# Patient Record
Sex: Female | Born: 1965 | Race: Black or African American | Hispanic: No | Marital: Single | Smoking: Never smoker
Health system: Southern US, Community
[De-identification: ages and names within clinical notes are randomized; demographics above are authoritative.]

## PROBLEM LIST (undated history)

## (undated) ENCOUNTER — Ambulatory Visit

## (undated) DIAGNOSIS — C50919 Malignant neoplasm of unspecified site of unspecified female breast: Secondary | ICD-10-CM

## (undated) DIAGNOSIS — T783XXA Angioneurotic edema, initial encounter: Secondary | ICD-10-CM

## (undated) DIAGNOSIS — I10 Essential (primary) hypertension: Secondary | ICD-10-CM

## (undated) DIAGNOSIS — E039 Hypothyroidism, unspecified: Secondary | ICD-10-CM

## (undated) DIAGNOSIS — F32A Depression, unspecified: Secondary | ICD-10-CM

## (undated) DIAGNOSIS — M1990 Unspecified osteoarthritis, unspecified site: Secondary | ICD-10-CM

## (undated) DIAGNOSIS — M25562 Pain in left knee: Secondary | ICD-10-CM

## (undated) DIAGNOSIS — T7840XA Allergy, unspecified, initial encounter: Secondary | ICD-10-CM

## (undated) DIAGNOSIS — G4730 Sleep apnea, unspecified: Secondary | ICD-10-CM

## (undated) DIAGNOSIS — M25561 Pain in right knee: Secondary | ICD-10-CM

## (undated) DIAGNOSIS — L509 Urticaria, unspecified: Secondary | ICD-10-CM

## (undated) DIAGNOSIS — E079 Disorder of thyroid, unspecified: Secondary | ICD-10-CM

## (undated) DIAGNOSIS — Z5189 Encounter for other specified aftercare: Secondary | ICD-10-CM

## (undated) HISTORY — DX: Depression, unspecified: F32.A

## (undated) HISTORY — DX: Malignant neoplasm of unspecified site of unspecified female breast: C50.919

## (undated) HISTORY — DX: Urticaria, unspecified: L50.9

## (undated) HISTORY — DX: Morbid (severe) obesity due to excess calories: E66.01

## (undated) HISTORY — PX: TUBAL LIGATION: SHX77

## (undated) HISTORY — DX: Allergy, unspecified, initial encounter: T78.40XA

## (undated) HISTORY — PX: OTHER SURGICAL HISTORY: SHX169

## (undated) HISTORY — DX: Unspecified osteoarthritis, unspecified site: M19.90

## (undated) HISTORY — DX: Sleep apnea, unspecified: G47.30

## (undated) HISTORY — PX: ADRENALECTOMY: SHX876

## (undated) HISTORY — DX: Angioneurotic edema, initial encounter: T78.3XXA

## (undated) HISTORY — DX: Pain in left knee: M25.562

## (undated) HISTORY — DX: Pain in right knee: M25.561

## (undated) HISTORY — DX: Encounter for other specified aftercare: Z51.89

## (undated) HISTORY — PX: THYROIDECTOMY, PARTIAL: SHX18

## (undated) HISTORY — PX: BREAST BIOPSY: SHX20

## (undated) HISTORY — PX: ABDOMINAL HYSTERECTOMY: SHX81

## (undated) HISTORY — DX: Disorder of thyroid, unspecified: E07.9

---
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
## 2020-07-05 ENCOUNTER — Other Ambulatory Visit: Payer: Self-pay

## 2020-07-05 ENCOUNTER — Encounter: Payer: Self-pay | Admitting: Internal Medicine

## 2020-07-05 ENCOUNTER — Ambulatory Visit (INDEPENDENT_AMBULATORY_CARE_PROVIDER_SITE_OTHER)
Admission: RE | Admit: 2020-07-05 | Discharge: 2020-07-05 | Disposition: A | Discharge status: Home / Self Care | Payer: Commercial Managed Care - PPO | Source: Ambulatory Visit | Attending: Internal Medicine | Admitting: Internal Medicine

## 2020-07-05 ENCOUNTER — Ambulatory Visit: Payer: Commercial Managed Care - PPO | Admitting: Internal Medicine

## 2020-07-05 VITALS — BP 120/80 | HR 74 | Temp 98.8°F | Ht 70.0 in | Wt 314.2 lb

## 2020-07-05 DIAGNOSIS — M25562 Pain in left knee: Secondary | ICD-10-CM

## 2020-07-05 DIAGNOSIS — M25561 Pain in right knee: Secondary | ICD-10-CM

## 2020-07-05 DIAGNOSIS — Z23 Encounter for immunization: Secondary | ICD-10-CM

## 2020-07-05 DIAGNOSIS — Z803 Family history of malignant neoplasm of breast: Secondary | ICD-10-CM

## 2020-07-05 DIAGNOSIS — Z124 Encounter for screening for malignant neoplasm of cervix: Secondary | ICD-10-CM | POA: Diagnosis not present
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
## 2020-07-05 NOTE — Addendum Note (Signed)
Addended by: Kern Reap B on: 07/05/2020 04:54 PM   Modules accepted: Orders

## 2020-07-05 NOTE — Patient Instructions (Signed)
-  Nice seeing you today!!  -Xrays of both knees have been requested today.  -Schedule follow up in 3 months for your physical. Please come in fasting that day.

## 2020-07-11 ENCOUNTER — Other Ambulatory Visit: Payer: Self-pay | Admitting: Internal Medicine

## 2020-07-11 DIAGNOSIS — Z1231 Encounter for screening mammogram for malignant neoplasm of breast: Secondary | ICD-10-CM

## 2020-07-11 DIAGNOSIS — M25562 Pain in left knee: Secondary | ICD-10-CM

## 2020-07-17 ENCOUNTER — Ambulatory Visit: Payer: Commercial Managed Care - PPO | Admitting: Family Medicine

## 2020-07-17 ENCOUNTER — Other Ambulatory Visit: Payer: Self-pay

## 2020-07-17 ENCOUNTER — Ambulatory Visit: Payer: Self-pay

## 2020-07-17 ENCOUNTER — Encounter: Payer: Self-pay | Admitting: Family Medicine

## 2020-07-17 VITALS — BP 120/74 | HR 68 | Ht 70.0 in | Wt 309.2 lb

## 2020-07-17 DIAGNOSIS — M25562 Pain in left knee: Secondary | ICD-10-CM

## 2020-07-17 DIAGNOSIS — G8929 Other chronic pain: Secondary | ICD-10-CM | POA: Diagnosis not present

## 2020-07-17 DIAGNOSIS — M25561 Pain in right knee: Secondary | ICD-10-CM

## 2020-07-17 NOTE — Progress Notes (Signed)
Subjective:    I'm seeing this patient as a consultation for:  Dr Montez Hageman. Note will be routed back to referring provider/PCP.  CC: B knee pain, L>R  I, Molly Weber, LAT, ATC, am serving as scribe for Dr. [name].  HPI: Pt is a 54 y/o female presenting w/ c/o chronic B knee pain, L>R, x 8-9 months w/ no known MOI.  She locates her L knee pain to medial and posterior and R knee pain to ant and post knee.  She notes that her L knee hurts more and her R knee swells more.  Radiating pain: No Knee swelling: yes in R knee B knee mechanical symptoms: yes Aggravating factors: weight bearing activity; stairs; transitioning from sit-to-stand Treatments tried: PT in 2019; Aleve; IBU; knee brace  Diagnostic imaging: B knee XR- 07/05/20  Past medical history, Surgical history, Family history, Social history, Allergies, and medications have been entered into the medical record, reviewed.   Review of Systems: No new headache, visual changes, nausea, vomiting, diarrhea, constipation, dizziness, abdominal pain, skin rash, fevers, chills, night sweats, weight loss, swollen lymph nodes, body aches, joint swelling, muscle aches, chest pain, shortness of breath, mood changes, visual or auditory hallucinations.   Objective:    Vitals:   07/17/20 0836  BP: 120/74  Pulse: 68  SpO2: 96%   General: Well Developed, well nourished, and in no acute distress.  Neuro/Psych: Alert and oriented x3, extra-ocular muscles intact, able to move all 4 extremities, sensation grossly intact. Skin: Warm and dry, no rashes noted.  Respiratory: Not using accessory muscles, speaking in full sentences, trachea midline.  Cardiovascular: Pulses palpable, no extremity edema. Abdomen: Does not appear distended. MSK: Bilateral knees difficult to tell effusion based on body habitus. Right knee no obvious effusion.  Varicosities present medial knee. Range of motion 0-120 degrees with crepitation. Tender palpation  medial joint line. Stable ligamentous exam. Negative McMurray's test.  Left knee no obvious effusion.  No significant varicosities. Range of motion 0-120 degrees with crepitation. Tender palpation medial joint line. Stable ligamentous exam. Negative Murray's test.   Lab and Radiology Results DG Knee Complete 4 Views Left  Result Date: 07/06/2020 CLINICAL DATA:  Left knee pain for 1 week. EXAM: LEFT KNEE - COMPLETE 4+ VIEW COMPARISON:  None. FINDINGS: The sunrise view is limited. The mineralization and alignment are normal. There is no evidence of acute fracture or dislocation. There are mild tricompartmental degenerative changes. No joint effusion or foreign body. IMPRESSION: No acute osseous findings. Mild tricompartmental degenerative changes. Electronically Signed   By: Carey Bullocks M.D.   On: 07/06/2020 15:07   DG Knee Complete 4 Views Right  Result Date: 07/06/2020 CLINICAL DATA:  Right knee pain and swelling for 1-2 weeks. No known injury. EXAM: RIGHT KNEE - COMPLETE 4+ VIEW COMPARISON:  None. FINDINGS: The sunrise view is mildly limited. The mineralization and alignment are normal. There is no evidence of acute fracture or dislocation. There are mild tricompartmental degenerative changes. Probable medial varicosities. No joint effusion or other focal soft tissue abnormalities identified. IMPRESSION: Mild tricompartmental degenerative changes. No acute osseous findings. Electronically Signed   By: Carey Bullocks M.D.   On: 07/06/2020 15:08   I, [name], personally (independently) visualized and performed the interpretation of the images attached in this note. Agree with radiology over read.  Mild medial compartment DJD both knees.  Procedure: Real-time Ultrasound Guided Injection of right knee lateral superior patellar space Device: Philips Affiniti 50G Images permanently stored  and available for review in PACS Verbal informed consent obtained.  Discussed risks and benefits of  procedure. Warned about infection bleeding damage to structures skin hypopigmentation and fat atrophy among others. Patient expresses understanding and agreement Time-out conducted.   Noted no overlying erythema, induration, or other signs of local infection.   Skin prepped in a sterile fashion.   Local anesthesia: Topical Ethyl chloride.   With sterile technique and under real time ultrasound guidance:  40 mg of Kenalog and 2 mL of Marcaine injected easily.   Completed without difficulty   Pain immediately resolved suggesting accurate placement of the medication.   Advised to call if fevers/chills, erythema, induration, drainage, or persistent bleeding.   Images permanently stored and available for review in the ultrasound unit.  Impression: Technically successful ultrasound guided injection.  Procedure: Real-time Ultrasound Guided Injection of left knee lateral superior patellar space Device: Philips Affiniti 50G Images permanently stored and available for review in PACS Verbal informed consent obtained.  Discussed risks and benefits of procedure. Warned about infection bleeding damage to structures skin hypopigmentation and fat atrophy among others. Patient expresses understanding and agreement Time-out conducted.   Noted no overlying erythema, induration, or other signs of local infection.   Skin prepped in a sterile fashion.   Local anesthesia: Topical Ethyl chloride.   With sterile technique and under real time ultrasound guidance:  40 mg of Kenalog and 2 mL of Marcaine injected easily.   Completed without difficulty   Pain immediately resolved suggesting accurate placement of the medication.   Advised to call if fevers/chills, erythema, induration, drainage, or persistent bleeding.   Images permanently stored and available for review in the ultrasound unit.  Impression: Technically successful ultrasound guided injection.      Impression and Recommendations:    Assessment and  Plan: 54 y.o. female with bilateral knee pain due to DJD.  Pain ongoing for several months now.  Discussed options.  Plan for quad strengthening Voltaren gel and steroid injection as above.  Also recommend Tylenol and limited oral NSAIDs.  Discussed that weight loss will be very helpful as well and will be essential for total knee replacement.  BMI must be less than 40 for a total knee replacement which is an achievable goal for her.  She is already working on weight loss on her own which is an excellent idea.  If steroid injections do not last long enough could proceed with hyaluronic acid injections.  Patient will notify me if we need to proceed with that.  Recheck back with me as needed..   Orders Placed This Encounter  Procedures  . [place] LIMITED JOINT SPACE STRUCTURES LOW BILAT(NO LINKED CHARGES)    Order Specific Question:   Reason for Exam (SYMPTOM  OR DIAGNOSIS REQUIRED)    Answer:   B knee pain    Order Specific Question:   Preferred imaging location?    Answer:   [facility]   No orders of the defined types were placed in this encounter.   Discussed warning signs or symptoms. Please see discharge instructions. Patient expresses understanding.   The above documentation has been reviewed and is accurate and complete [name], M.D.

## 2020-07-17 NOTE — Patient Instructions (Signed)
Thank you for coming in today. Work on Systems developer.  Work on weight loss.  Use voltaren gel up to 4x daily.  Recheck with me as needed.

## 2020-08-06 ENCOUNTER — Other Ambulatory Visit: Payer: Self-pay | Admitting: Internal Medicine

## 2020-08-06 DIAGNOSIS — Z1231 Encounter for screening mammogram for malignant neoplasm of breast: Secondary | ICD-10-CM

## 2020-08-07 ENCOUNTER — Telehealth: Payer: Self-pay | Admitting: *Deleted

## 2020-08-07 ENCOUNTER — Encounter: Payer: Self-pay | Admitting: Nurse Practitioner

## 2020-08-07 ENCOUNTER — Ambulatory Visit (INDEPENDENT_AMBULATORY_CARE_PROVIDER_SITE_OTHER): Payer: Commercial Managed Care - PPO | Admitting: Nurse Practitioner

## 2020-08-07 ENCOUNTER — Other Ambulatory Visit: Payer: Self-pay

## 2020-08-07 VITALS — BP 120/82 | Ht 70.0 in | Wt 305.2 lb

## 2020-08-07 DIAGNOSIS — Z803 Family history of malignant neoplasm of breast: Secondary | ICD-10-CM | POA: Diagnosis not present

## 2020-08-07 DIAGNOSIS — Z01419 Encounter for gynecological examination (general) (routine) without abnormal findings: Secondary | ICD-10-CM

## 2020-08-07 DIAGNOSIS — Z90711 Acquired absence of uterus with remaining cervical stump: Secondary | ICD-10-CM | POA: Diagnosis not present

## 2020-08-07 DIAGNOSIS — Z113 Encounter for screening for infections with a predominantly sexual mode of transmission: Secondary | ICD-10-CM | POA: Diagnosis not present

## 2020-08-07 NOTE — Telephone Encounter (Signed)
-----  Message from Tamela Gammon, NP sent at 08/07/2020 11:02 AM EDT ----- Please send referral to genetic specialist for BRCA testing.

## 2020-08-07 NOTE — Telephone Encounter (Signed)
Referral placed at [facility] they will call to schedule. 

## 2020-08-07 NOTE — Patient Instructions (Addendum)
[facility] Maintenance for Postmenopausal Women Menopause is a normal process in which your ability to get pregnant comes to an end. This process happens slowly over many months or years, usually between the ages of 22 and 53. Menopause is complete when you have missed your menstrual periods for 12 months. It is important to talk with your health care provider about some of the most common conditions that affect women after menopause (postmenopausal women). These include heart disease, cancer, and bone loss (osteoporosis). Adopting a healthy lifestyle and getting preventive care can help to promote your health and wellness. The actions you take can also lower your chances of developing some of these common conditions. What should I know about menopause? During menopause, you may get a number of symptoms, such as:  Hot flashes. These can be moderate or severe.  Night sweats.  Decrease in sex drive.  Mood swings.  Headaches.  Tiredness.  Irritability.  Memory problems.  Insomnia. Choosing to treat or not to treat these symptoms is a decision that you make with your health care provider. Do I need hormone replacement therapy?  Hormone replacement therapy is effective in treating symptoms that are caused by menopause, such as hot flashes and night sweats.  Hormone replacement carries certain risks, especially as you become older. If you are thinking about using estrogen or estrogen with progestin, discuss the benefits and risks with your health care provider. What is my risk for heart disease and stroke? The risk of heart disease, heart attack, and stroke increases as you age. One of the causes may be a change in the body's hormones during menopause. This can affect how your body uses dietary fats, triglycerides, and cholesterol. Heart attack and stroke are medical emergencies. There are many things that you can do to help prevent heart disease and stroke. Watch your blood  pressure  High blood pressure causes heart disease and increases the risk of stroke. This is more likely to develop in people who have high blood pressure readings, are of African descent, or are overweight.  Have your blood pressure checked: ? Every 3-5 years if you are 6-33 years of age. ? Every year if you are 48 years old or older. Eat a healthy diet   Eat a diet that includes plenty of vegetables, fruits, low-fat dairy products, and lean protein.  Do not eat a lot of foods that are high in solid fats, added sugars, or sodium. Get regular exercise Get regular exercise. This is one of the most important things you can do for your health. Most adults should:  Try to exercise for at least 150 minutes each week. The exercise should increase your heart rate and make you sweat (moderate-intensity exercise).  Try to do strengthening exercises at least twice each week. Do these in addition to the moderate-intensity exercise.  Spend less time sitting. Even light physical activity can be beneficial. Other tips  Work with your health care provider to achieve or maintain a healthy weight.  Do not use any products that contain nicotine or tobacco, such as cigarettes, e-cigarettes, and chewing tobacco. If you need help quitting, ask your health care provider.  Know your numbers. Ask your health care provider to check your cholesterol and your blood sugar (glucose). Continue to have your blood tested as directed by your health care provider. Do I need screening for cancer? Depending on your health history and family history, you may need to have cancer screening at different stages of  your life. This may include screening for:  Breast cancer.  Cervical cancer.  Lung cancer.  Colorectal cancer. What is my risk for osteoporosis? After menopause, you may be at increased risk for osteoporosis. Osteoporosis is a condition in which bone destruction happens more quickly than new bone creation.  To help prevent osteoporosis or the bone fractures that can happen because of osteoporosis, you may take the following actions:  If you are 47-48 years old, get at least 1,000 mg of calcium and at least 600 mg of vitamin D per day.  If you are older than age 33 but younger than age 25, get at least 1,200 mg of calcium and at least 600 mg of vitamin D per day.  If you are older than age 24, get at least 1,200 mg of calcium and at least 800 mg of vitamin D per day. Smoking and drinking excessive alcohol increase the risk of osteoporosis. Eat foods that are rich in calcium and vitamin D, and do weight-bearing exercises several times each week as directed by your health care provider. How does menopause affect my mental health? Depression may occur at any age, but it is more common as you become older. Common symptoms of depression include:  Low or sad mood.  Changes in sleep patterns.  Changes in appetite or eating patterns.  Feeling an overall lack of motivation or enjoyment of activities that you previously enjoyed.  Frequent crying spells. Talk with your health care provider if you think that you are experiencing depression. General instructions See your health care provider for regular wellness exams and vaccines. This may include:  Scheduling regular health, dental, and eye exams.  Getting and maintaining your vaccines. These include: ? Influenza vaccine. Get this vaccine each year before the flu season begins. ? Pneumonia vaccine. ? Shingles vaccine. ? Tetanus, diphtheria, and pertussis (Tdap) booster vaccine. Your health care provider may also recommend other immunizations. Tell your health care provider if you have ever been abused or do not feel safe at home. Summary  Menopause is a normal process in which your ability to get pregnant comes to an end.  This condition causes hot flashes, night sweats, decreased interest in sex, mood swings, headaches, or lack of  sleep.  Treatment for this condition may include hormone replacement therapy.  Take actions to keep yourself healthy, including exercising regularly, eating a healthy diet, watching your weight, and checking your blood pressure and blood sugar levels.  Get screened for cancer and depression. Make sure that you are up to date with all your vaccines. This information is not intended to replace advice given to you by your health care provider. Make sure you discuss any questions you have with your health care provider. Document Revised: 10/13/2018 Document Reviewed: 10/13/2018 Elsevier Patient Education  2020 [employer].

## 2020-08-07 NOTE — Progress Notes (Signed)
   Pamela Medina 1966/04/07 [id number]   History:  54 y.o. G2P0021 presents as new patient to establish care. 2006 partial hysterectomy due to fibroids. Normal pap and mammogram history. Mother with history of breast and ovarian cancer, MGM with breast cancer, and two maternal aunts with breast and uterine cancer. She has not been tested for BRCA gene. Sexually active, would like STD testing today.   Gynecologic History No LMP recorded. Patient has had a hysterectomy.   Last Pap: 4 years ago. Results were: normal Last mammogram: >1 year ago. Results were: normal Last colonoscopy: 2019. Results were: polyps 3 year follow up recommended Last DEXA: Never  Past medical history, past surgical history, family history and social history were all reviewed and documented in the EPIC chart.  ROS:  A ROS was performed and pertinent positives and negatives are included.  Exam:  Vitals:   08/07/20 1042  BP: 120/82  Weight: (!) 305 lb 3.2 oz (138.4 kg)  Height: _0  (1.778 m)   Body mass index is 43.79 kg/m.  General appearance:  Normal Thyroid:  Symmetrical, normal in size, without palpable masses or nodularity. Respiratory  Auscultation:  Clear without wheezing or rhonchi Cardiovascular  Auscultation:  Regular rate, without rubs, murmurs or gallops  Edema/varicosities:  Not grossly evident Abdominal  Soft,nontender, without masses, guarding or rebound.  Liver/spleen:  No organomegaly noted  Hernia:  None appreciated  Skin  Inspection:  Grossly normal   Breasts: Examined lying and sitting.   Right: Without masses, retractions, discharge or axillary adenopathy.   Left: Without masses, retractions, discharge or axillary adenopathy. Gentitourinary   Inguinal/mons:  Normal without inguinal adenopathy  External genitalia:  Normal  BUS/Urethra/Skene's glands:  Normal  Vagina:  Normal  Cervix:  Normal  Uterus: Difficult to palpate due to body habitus but no gross masses or  tenderness  Adnexa/parametria:     Rt: Without masses or tenderness.   Lt: Without masses or tenderness.  Anus and perineum: Normal   Assessment/Plan:  54 y.o. [id number] as new patient.    Well woman exam with routine gynecological exam - Education provided on SBEs, importance of preventative screenings, current guidelines, high calcium diet, regular exercise, and multivitamin daily. Labs with primary care.   Family history of breast cancer - mother, MGM, and two aunts. Recommend BRCA testing for her and her daughter. Will send referral for genetic specialist.  Screen for STD (sexually transmitted disease) - Plan: C. trachomatis/N. gonorrhoeae RNA, HIV Antibody (routine testing w rflx), RPR  History of partial hysterectomy - 2006 for fibroids  Screening for cervical cancer -most recent Pap greater than 4 years ago.  Normal Pap history.  Pap with HR HPV typing today.   Screening for breast cancer -normal mammogram history.  Normal breast exam today.  Schedule for mammogram 08/09/2020.  Follow up in 1 year for annual       Regino Ramirez, 10:53 AM 08/07/2020

## 2020-08-08 LAB — HIV ANTIBODY (ROUTINE TESTING W REFLEX): HIV 1&2 Ab, 4th Generation: NONREACTIVE

## 2020-08-08 LAB — C. TRACHOMATIS/N. GONORRHOEAE RNA
C. trachomatis RNA, TMA: NOT DETECTED
N. gonorrhoeae RNA, TMA: NOT DETECTED

## 2020-08-08 LAB — RPR: RPR Ser Ql: NONREACTIVE

## 2020-08-09 ENCOUNTER — Telehealth: Payer: Self-pay | Admitting: Genetic Counselor

## 2020-08-09 ENCOUNTER — Ambulatory Visit
Admission: RE | Admit: 2020-08-09 | Discharge: 2020-08-09 | Disposition: A | Discharge status: Home / Self Care | Payer: Commercial Managed Care - PPO | Source: Ambulatory Visit | Attending: Internal Medicine | Admitting: Internal Medicine

## 2020-08-09 ENCOUNTER — Other Ambulatory Visit: Payer: Self-pay | Admitting: Nurse Practitioner

## 2020-08-09 ENCOUNTER — Other Ambulatory Visit: Payer: Self-pay

## 2020-08-09 DIAGNOSIS — N760 Acute vaginitis: Secondary | ICD-10-CM

## 2020-08-09 DIAGNOSIS — Z1231 Encounter for screening mammogram for malignant neoplasm of breast: Secondary | ICD-10-CM

## 2020-08-09 LAB — PAP, TP IMAGING W/ HPV RNA, RFLX HPV TYPE 16,18/45: HPV DNA High Risk: NOT DETECTED

## 2020-08-09 MED ORDER — METRONIDAZOLE 0.75 % VA GEL: 1.0000 | Freq: Two times a day (BID) | VAGINAL | 0 refills | Status: AC

## 2020-08-09 NOTE — Telephone Encounter (Signed)
Received a genetic counseling referral from [facility] for fhx of breast cancer. Pamela Medina has been cld and scheduled to see Cari on 10/28 at 1pm. Pt aware to arrive 15 minutes early.

## 2020-08-10 NOTE — Telephone Encounter (Signed)
Patient scheduled on 08/30/20 @ 2:00pm

## 2020-08-23 ENCOUNTER — Ambulatory Visit (INDEPENDENT_AMBULATORY_CARE_PROVIDER_SITE_OTHER): Payer: Commercial Managed Care - PPO | Admitting: Internal Medicine

## 2020-08-23 ENCOUNTER — Encounter: Payer: Self-pay | Admitting: Internal Medicine

## 2020-08-23 ENCOUNTER — Other Ambulatory Visit: Payer: Self-pay

## 2020-08-23 VITALS — BP 130/90 | HR 63 | Temp 97.9°F | Ht 69.75 in | Wt 310.7 lb

## 2020-08-23 DIAGNOSIS — Z0000 Encounter for general adult medical examination without abnormal findings: Secondary | ICD-10-CM

## 2020-08-23 NOTE — Patient Instructions (Signed)
-Nice seeing you today!!  -Lab work today; will notify you once results are available.  -Remember your shingles and COVID booster at the pharmacy.  -Schedule follow up in 1 year or sooner as needed.   Preventive Care 33-52 Years Old, Female Preventive care refers to visits with your health care provider and lifestyle choices that can promote health and wellness. This includes:  A yearly physical exam. This may also be called an annual well check.  Regular dental visits and eye exams.  Immunizations.  Screening for certain conditions.  Healthy lifestyle choices, such as eating a healthy diet, getting regular exercise, not using drugs or products that contain nicotine and tobacco, and limiting alcohol use. What can I expect for my preventive care visit? Physical exam Your health care provider will check your:  Height and weight. This may be used to calculate body mass index (BMI), which tells if you are at a healthy weight.  Heart rate and blood pressure.  Skin for abnormal spots. Counseling Your health care provider may ask you questions about your:  Alcohol, tobacco, and drug use.  Emotional well-being.  Home and relationship well-being.  Sexual activity.  Eating habits.  Work and work Statistician.  Method of birth control.  Menstrual cycle.  Pregnancy history. What immunizations do I need?  Influenza (flu) vaccine  This is recommended every year. Tetanus, diphtheria, and pertussis (Tdap) vaccine  You may need a Td booster every 10 years. Varicella (chickenpox) vaccine  You may need this if you have not been vaccinated. Zoster (shingles) vaccine  You may need this after age 45. Measles, mumps, and rubella (MMR) vaccine  You may need at least one dose of MMR if you were born in 1957 or later. You may also need a second dose. Pneumococcal conjugate (PCV13) vaccine  You may need this if you have certain conditions and were not previously  vaccinated. Pneumococcal polysaccharide (PPSV23) vaccine  You may need one or two doses if you smoke cigarettes or if you have certain conditions. Meningococcal conjugate (MenACWY) vaccine  You may need this if you have certain conditions. Hepatitis A vaccine  You may need this if you have certain conditions or if you travel or work in places where you may be exposed to hepatitis A. Hepatitis B vaccine  You may need this if you have certain conditions or if you travel or work in places where you may be exposed to hepatitis B. Haemophilus influenzae type b (Hib) vaccine  You may need this if you have certain conditions. Human papillomavirus (HPV) vaccine  If recommended by your health care provider, you may need three doses over 6 months. You may receive vaccines as individual doses or as more than one vaccine together in one shot (combination vaccines). Talk with your health care provider about the risks and benefits of combination vaccines. What tests do I need? Blood tests  Lipid and cholesterol levels. These may be checked every 5 years, or more frequently if you are over 85 years old.  Hepatitis C test.  Hepatitis B test. Screening  Lung cancer screening. You may have this screening every year starting at age 14 if you have a 30-pack-year history of smoking and currently smoke or have quit within the past 15 years.  Colorectal cancer screening. All adults should have this screening starting at age 48 and continuing until age 78. Your health care provider may recommend screening at age 13 if you are at increased risk. You will have tests  every 1-10 years, depending on your results and the type of screening test.  Diabetes screening. This is done by checking your blood sugar (glucose) after you have not eaten for a while (fasting). You may have this done every 1-3 years.  Mammogram. This may be done every 1-2 years. Talk with your health care provider about when you should start  having regular mammograms. This may depend on whether you have a family history of breast cancer.  BRCA-related cancer screening. This may be done if you have a family history of breast, ovarian, tubal, or peritoneal cancers.  Pelvic exam and Pap test. This may be done every 3 years starting at age 66. Starting at age 67, this may be done every 5 years if you have a Pap test in combination with an HPV test. Other tests  Sexually transmitted disease (STD) testing.  Bone density scan. This is done to screen for osteoporosis. You may have this scan if you are at high risk for osteoporosis. Follow these instructions at home: Eating and drinking  Eat a diet that includes fresh fruits and vegetables, whole grains, lean protein, and low-fat dairy.  Take vitamin and mineral supplements as recommended by your health care provider.  Do not drink alcohol if: ? Your health care provider tells you not to drink. ? You are pregnant, may be pregnant, or are planning to become pregnant.  If you drink alcohol: ? Limit how much you have to 0-1 drink a day. ? Be aware of how much alcohol is in your drink. In the [place] one drink equals one 12 oz bottle of beer (355 mL), one 5 oz glass of wine (148 mL), or one 1 oz glass of hard liquor (44 mL). Lifestyle  Take daily care of your teeth and gums.  Stay active. Exercise for at least 30 minutes on 5 or more days each week.  Do not use any products that contain nicotine or tobacco, such as cigarettes, e-cigarettes, and chewing tobacco. If you need help quitting, ask your health care provider.  If you are sexually active, practice safe sex. Use a condom or other form of birth control (contraception) in order to prevent pregnancy and STIs (sexually transmitted infections).  If told by your health care provider, take low-dose aspirin daily starting at age 27. What's next?  Visit your health care provider once a year for a well check visit.  Ask your health  care provider how often you should have your eyes and teeth checked.  Stay up to date on all vaccines. This information is not intended to replace advice given to you by your health care provider. Make sure you discuss any questions you have with your health care provider. Document Revised: 07/01/2018 Document Reviewed: 07/01/2018 Elsevier Patient Education  2020 [employer].
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
## 2020-08-24 ENCOUNTER — Other Ambulatory Visit: Payer: Self-pay | Admitting: Internal Medicine

## 2020-08-24 ENCOUNTER — Encounter: Payer: Self-pay | Admitting: Internal Medicine

## 2020-08-24 DIAGNOSIS — E038 Other specified hypothyroidism: Secondary | ICD-10-CM | POA: Insufficient documentation

## 2020-08-24 DIAGNOSIS — R7989 Other specified abnormal findings of blood chemistry: Secondary | ICD-10-CM | POA: Insufficient documentation

## 2020-08-24 DIAGNOSIS — E039 Hypothyroidism, unspecified: Secondary | ICD-10-CM | POA: Insufficient documentation

## 2020-08-24 DIAGNOSIS — E559 Vitamin D deficiency, unspecified: Secondary | ICD-10-CM | POA: Insufficient documentation

## 2020-08-24 MED ORDER — VITAMIN D (ERGOCALCIFEROL) 1.25 MG (50000 UNIT) PO CAPS: 50000.0000 [IU] | ORAL_CAPSULE | ORAL | 0 refills | Status: AC

## 2020-08-25 LAB — CBC WITH DIFFERENTIAL/PLATELET
Absolute Monocytes: 440 cells/uL (ref 200–950)
Basophils Absolute: 28 cells/uL (ref 0–200)
Basophils Relative: 0.4 %
Eosinophils Absolute: 99 cells/uL (ref 15–500)
Eosinophils Relative: 1.4 %
HCT: 38.5 % (ref 35.0–45.0)
Hemoglobin: 12.5 g/dL (ref 11.7–15.5)
Lymphs Abs: 2819 cells/uL (ref 850–3900)
MCH: 29.7 pg (ref 27.0–33.0)
MCHC: 32.5 g/dL (ref 32.0–36.0)
MCV: 91.4 fL (ref 80.0–100.0)
MPV: 11 fL (ref 7.5–12.5)
Monocytes Relative: 6.2 %
Neutro Abs: 3713 cells/uL (ref 1500–7800)
Neutrophils Relative %: 52.3 %
Platelets: 325 10*3/uL (ref 140–400)
RBC: 4.21 10*6/uL (ref 3.80–5.10)
RDW: 12 % (ref 11.0–15.0)
Total Lymphocyte: 39.7 %
WBC: 7.1 10*3/uL (ref 3.8–10.8)

## 2020-08-25 LAB — T3, FREE: T3, Free: 3.4 pg/mL (ref 2.3–4.2)

## 2020-08-25 LAB — TEST AUTHORIZATION

## 2020-08-25 LAB — COMPREHENSIVE METABOLIC PANEL
AG Ratio: 1.2 (calc) (ref 1.0–2.5)
ALT: 12 U/L (ref 6–29)
AST: 13 U/L (ref 10–35)
Albumin: 3.6 g/dL (ref 3.6–5.1)
Alkaline phosphatase (APISO): 89 U/L (ref 37–153)
BUN: 9 mg/dL (ref 7–25)
CO2: 32 mmol/L (ref 20–32)
Calcium: 9 mg/dL (ref 8.6–10.4)
Chloride: 100 mmol/L (ref 98–110)
Creat: 1.01 mg/dL (ref 0.50–1.05)
Globulin: 2.9 g/dL (calc) (ref 1.9–3.7)
Glucose, Bld: 98 mg/dL (ref 65–99)
Potassium: 3.9 mmol/L (ref 3.5–5.3)
Sodium: 140 mmol/L (ref 135–146)
Total Bilirubin: 0.5 mg/dL (ref 0.2–1.2)
Total Protein: 6.5 g/dL (ref 6.1–8.1)

## 2020-08-25 LAB — HEMOGLOBIN A1C
Hgb A1c MFr Bld: 5.2 % of total Hgb (ref <5.7)
Mean Plasma Glucose: 103 (calc)
eAG (mmol/L): 5.7 (calc)

## 2020-08-25 LAB — VITAMIN B12: Vitamin B-12: 457 pg/mL (ref 200–1100)

## 2020-08-25 LAB — LIPID PANEL
Cholesterol: 179 mg/dL (ref <200)
HDL: 57 mg/dL (ref >=50)
LDL Cholesterol (Calc): 107 mg/dL (calc) — ABNORMAL HIGH
Non-HDL Cholesterol (Calc): 122 mg/dL (calc) (ref <130)
Total CHOL/HDL Ratio: 3.1 (calc) (ref <5.0)
Triglycerides: 64 mg/dL (ref <150)

## 2020-08-25 LAB — TSH: TSH: 5.55 mIU/L — ABNORMAL HIGH

## 2020-08-25 LAB — VITAMIN D 25 HYDROXY (VIT D DEFICIENCY, FRACTURES): Vit D, 25-Hydroxy: 21 ng/mL — ABNORMAL LOW (ref 30–100)

## 2020-08-25 LAB — T4, FREE: Free T4: 1.3 ng/dL (ref 0.8–1.8)

## 2020-08-26 ENCOUNTER — Encounter: Payer: Self-pay | Admitting: Internal Medicine

## 2020-08-28 ENCOUNTER — Other Ambulatory Visit: Payer: Self-pay | Admitting: Internal Medicine

## 2020-08-28 DIAGNOSIS — R7989 Other specified abnormal findings of blood chemistry: Secondary | ICD-10-CM

## 2020-08-28 NOTE — Telephone Encounter (Signed)
Called and reviewed labs with patient.  See lab result note.

## 2020-08-30 ENCOUNTER — Other Ambulatory Visit: Payer: Self-pay

## 2020-08-30 ENCOUNTER — Inpatient Hospital Stay: Payer: Commercial Managed Care - PPO

## 2020-08-30 ENCOUNTER — Inpatient Hospital Stay: Payer: Commercial Managed Care - PPO | Attending: Internal Medicine | Admitting: Genetic Counselor

## 2020-08-30 ENCOUNTER — Encounter: Payer: Self-pay | Admitting: Genetic Counselor

## 2020-08-30 DIAGNOSIS — Z803 Family history of malignant neoplasm of breast: Secondary | ICD-10-CM

## 2020-08-30 DIAGNOSIS — Z8049 Family history of malignant neoplasm of other genital organs: Secondary | ICD-10-CM | POA: Insufficient documentation

## 2020-08-30 DIAGNOSIS — Z8041 Family history of malignant neoplasm of ovary: Secondary | ICD-10-CM

## 2020-08-30 HISTORY — DX: Family history of malignant neoplasm of breast: Z80.3

## 2020-08-30 HISTORY — DX: Family history of malignant neoplasm of other genital organs: Z80.49

## 2020-08-30 HISTORY — DX: Family history of malignant neoplasm of ovary: Z80.41

## 2020-09-03 ENCOUNTER — Encounter: Payer: Self-pay | Admitting: Genetic Counselor

## 2020-09-03 NOTE — Progress Notes (Deleted)
EFERRING PROVIDER: Philip Aspen, Limmie Patricia, MD [address] [place],  [place] [address]  PRIMARY PROVIDER:  Philip Aspen, Limmie Patricia, MD  PRIMARY REASON FOR VISIT:  1. Family history of breast cancer   2. Family history of ovarian cancer   3. Family history of uterine cancer      HISTORY OF PRESENT ILLNESS:   Pamela Medina, a 54 y.o. female, was seen for a [place] cancer genetics consultation at the request of Dr. Philip Aspen due to a {Personal/family:20331} history of {cancer/polyps}.  [name] presents to clinic today to discuss the possibility of a hereditary predisposition to cancer, genetic testing, and to further clarify her future cancer risks, as well as potential cancer risks for family members.   In ***, at the age of ***, Ms. Hannis was diagnosed with {CA PATHOLOGY:63853} of the {right left (wildcard):15202} {CA UEAVW:09811}. The treatment plan ***.    *** [name] is a 54 y.o. female with no personal history of cancer.    CANCER HISTORY:  Oncology History   No history exists.     RISK FACTORS:  Menarche was at age ***.  First live birth at age ***.  OCP use for approximately {Numbers 1-12 multi-select:20307} years.  Ovaries intact: {Yes/No-Ex:120004}.  Hysterectomy: {Yes/No-Ex:120004}.  Menopausal status: {Menopause:31378}.  HRT use: {Numbers 1-12 multi-select:20307} years. Colonoscopy: {Yes/No-Ex:120004}; {normal/abnormal/not examined:14677}. Mammogram within the last year: {Yes/No-Ex:120004}. Number of breast biopsies: {Numbers 1-12 multi-select:20307}. Up to date with pelvic exams: {Yes/No-Ex:120004}. Any excessive radiation exposure in the past: {Yes/No-Ex:120004}  Past Medical History:  Diagnosis Date  . Family history of breast cancer 08/30/2020  . Family history of ovarian cancer 08/30/2020  . Family history of uterine cancer 08/30/2020  . Knee pain, bilateral   . Morbid obesity (HCC)     Past Surgical History:   Procedure Laterality Date  . ADRENALECTOMY     left  . BREAST BIOPSY Bilateral    9 or 10 years ago/ benign  . THYROIDECTOMY, PARTIAL      Social History   Socioeconomic History  . Marital status: Single    Spouse name: Not on file  . Number of children: Not on file  . Years of education: Not on file  . Highest education level: Not on file  Occupational History  . Not on file  Tobacco Use  . Smoking status: Never Smoker  . Smokeless tobacco: Never Used  Vaping Use  . Vaping Use: Never used  Substance and Sexual Activity  . Alcohol use: Yes    Comment: occasional  . Drug use: Never  . Sexual activity: Yes    Partners: Male  Other Topics Concern  . Not on file  Social History Narrative  . Not on file   Social Determinants of Health   Financial Resource Strain:   . Difficulty of Paying Living Expenses: Not on file  Food Insecurity:   . Worried About Programme researcher, broadcasting/film/video in the Last Year: Not on file  . Ran Out of Food in the Last Year: Not on file  Transportation Needs:   . Lack of Transportation (Medical): Not on file  . Lack of Transportation (Non-Medical): Not on file  Physical Activity:   . Days of Exercise per Week: Not on file  . Minutes of Exercise per Session: Not on file  Stress:   . Feeling of Stress : Not on file  Social Connections:   . Frequency of Communication with Friends and Family: Not on file  .  Frequency of Social Gatherings with Friends and Family: Not on file  . Attends Religious Services: Not on file  . Active Member of Clubs or Organizations: Not on file  . Attends Banker Meetings: Not on file  . Marital Status: Not on file     FAMILY HISTORY:  We obtained a detailed, 4-generation family history.  Significant diagnoses are listed below: Family History  Problem Relation Age of Onset  . Breast cancer Mother 40  . Ovarian cancer Mother        dx early 25s  . Breast cancer Maternal Grandmother        dx late 27s  .  Breast cancer Maternal Aunt        dx late 66s  . Uterine cancer Maternal Aunt        dx 13s  . Cancer Other        MGM's brother, unknown type  . Uterine cancer Maternal Aunt        dx 58s  . Cancer Other        MGM's sister, unknown type    [name] is {aware/unaware} of previous family history of genetic testing for hereditary cancer risks. Patient's maternal ancestors are of *** descent, and paternal ancestors are of *** descent. There {IS NO:12509} reported Ashkenazi Jewish ancestry. There {IS NO:12509} known consanguinity.  GENETIC COUNSELING ASSESSMENT: [name] is a 54 y.o. female with a {Personal/family:20331} history of {cancer/polyps} which is somewhat suggestive of a {DISEASE} and predisposition to cancer given ***. We, therefore, discussed and recommended the following at today's visit.   DISCUSSION: We discussed that *** - ***% of *** is hereditary, with most cases of hereditary *** cancer associated with ***.  There are other genes that can be associated with hereditary *** cancer syndromes.  These include ***.  We discussed that testing is beneficial for several reasons, including knowing about other cancer risks, identifying potential screening and risk-reduction options that may be appropriate, and to understand if other family members could be at risk for cancer and allow them to undergo genetic testing.  We reviewed the characteristics, features and inheritance patterns of hereditary cancer syndromes. We also discussed genetic testing, including the appropriate family members to test, the process of testing, insurance coverage and turn-around-time for results. We discussed the implications of a negative, positive, carrier and/or variant of uncertain significant result. We discussed that negative results would be uninformative given that [name] does not have a personal history of cancer. We recommended [name] pursue genetic testing for a panel that  contains genes associated with ***.  [name] was offered a common hereditary cancer panel (48 genes) and an expanded pan-cancer panel (85 genes). [name] was informed of the benefits and limitations of each panel, including that expanded pan-cancer panels contain several preliminary evidence genes that do not have clear management guidelines at this point in time.  We also discussed that as the number of genes included on a panel increases, the chances of variants of uncertain significance increases.  After considering the benefits and limitations of each gene panel, Ms. Dost elected to have an *** through ***.   Based on Ms. Monts's {Personal/family:20331} history of cancer, she meets medical criteria for genetic testing. Despite that she meets criteria, she may still have an out of pocket cost. We discussed that if her out of pocket cost for testing is over $100, the laboratory will call and confirm whether she wants to proceed with  testing.  If the out of pocket cost of testing is less than $100 she will be billed by the genetic testing laboratory.   ***We reviewed the characteristics, features and inheritance patterns of hereditary cancer syndromes. We also discussed genetic testing, including the appropriate family members to test, the process of testing, insurance coverage and turn-around-time for results. We discussed the implications of a negative, positive and/or variant of uncertain significant result. In order to get genetic test results in a timely manner so that Ms. Demers can use these genetic test results for surgical decisions, we recommended Ms. Scorsone pursue genetic testing for the ***. Once complete, we recommend Ms. Newsome pursue reflex genetic testing to the *** gene panel.   Based on Ms. Bodner's {Personal/family:20331} history of cancer, she meets medical criteria for genetic testing. Despite that she meets criteria, she may still have an out of pocket  cost.   ***We discussed with Ms. Linsley that the {Personal/family:20331} history does not meet insurance or NCCN criteria for genetic testing and, therefore, is not highly consistent with a familial hereditary cancer syndrome.  We feel she is at low risk to harbor a gene mutation associated with such a condition. Thus, we did not recommend any genetic testing, at this time, and recommended Ms. Steinhoff continue to follow the cancer screening guidelines given by her primary healthcare provider.  ***In order to estimate her chance of having a {CA GENE:62345} mutation, we used statistical models ({GENMODELS:62370}) that consider her personal medical history, family history and ancestry.  Because each model is different, there can be a lot of variability in the risks they give.  Therefore, these numbers must be considered a rough range and not a precise risk of having a {CA GENE:62345} mutation.  These models estimate that she has approximately a ***-***% chance of having a mutation. Based on this assessment of her family and personal history, genetic testing {IS/ISNOT:34056} recommended.  ***Based on the patient's {Personal/family:20331} history, a statistical model ({GENMODELS:62370}) was used to estimate her risk of developing {CA HX:54794}. This estimates her lifetime risk of developing {CA HX:54794} to be approximately ***%. This estimation does not consider any genetic testing results.  The patient's lifetime breast cancer risk is a preliminary estimate based on available information using one of several models endorsed by the American Cancer Society (ACS). The ACS recommends consideration of breast MRI screening as an adjunct to mammography for patients at high risk (defined as 20% or greater lifetime risk).   ***Ms. Kudo has been determined to be at high risk for breast cancer.  Therefore, we recommend that annual screening with mammography and breast MRI be performed.  ***begin at age 42, or 10  years prior to the age of breast cancer diagnosis in a relative (whichever is earlier).  We discussed that Ms. Hands should discuss her individual situation with her referring physician and determine a breast cancer screening plan with which they are both comfortable.    We discussed that some people do not want to undergo genetic testing due to fear of genetic discrimination.  A federal law called the Genetic Information Non-Discrimination Act (GINA) of 2008 helps protect individuals against genetic discrimination based on their genetic test results.  It impacts both health insurance and employment.  With health insurance, it protects against increased premiums, being kicked off insurance or being forced to take a test in order to be insured.  For employment it protects against hiring, firing and promoting decisions based on genetic test results.  GINA does not apply to those in the [employer] and [employer], those who work for companies with less than 15 employees, and new life insurance or long-term disability insurance policies.  Health status due to a cancer diagnosis is not protected under GINA.  PLAN: After considering the risks, benefits, and limitations, Ms. Arlice Coltdmondson provided informed consent to pursue genetic testing and the blood sample was sent to {Lab} Laboratories for analysis of the {test}. Results should be available within approximately {TAT TIME} weeks' time, at which point they will be disclosed by telephone to Ms. Trainer, as will any additional recommendations warranted by these results. Ms. Arlice Coltdmondson will receive a summary of her genetic counseling visit and a copy of her results once available. This information will also be available in Epic.   *** Despite our recommendation, Ms. Dinius did not wish to pursue genetic testing at today's visit. We understand this decision and remain available to coordinate genetic testing at any time in the future. We, therefore, recommend Ms. Harts continue  to follow the cancer screening guidelines given by her primary healthcare provider.  ***Based on Ms. Zettel's family history, we recommended her ***, who was diagnosed with *** at age ***, have genetic counseling and testing. Ms. Arlice Coltdmondson will let us know if we can be of any assistance in coordinating genetic counseling and/or testing for this family member.   Lastly, we encouraged Ms. Shutes to remain in contact with cancer genetics annually so that we can continuously update the family history and inform her of any changes in cancer genetics and testing that may be of benefit for this family.   Ms. Cristina Gongdmondson's questions were answered to her satisfaction today. Our contact information was provided should additional questions or concerns arise. Thank you for the referral and allowing us to share in the care of your patient.   Licet Dunphy M. Rennie PlowmanKoerner, MS Genetic Counselor [email or web address] (P) [phone]   The patient was seen for a total of *** minutes in face-to-face genetic counseling.  This patient was discussed with Drs. Magrinat, Pamelia HoitGudena and/or Mosetta PuttFeng who agrees with the above.    _______________________________________________________________________ For Office Staff:  Number of people involved in session: *** Was an Intern/ student involved with case: {YES/NO:63}

## 2020-09-03 NOTE — Progress Notes (Signed)
REFERRING PROVIDER: Tamela Gammon, NP [place],  [place] [address]  PRIMARY PROVIDER:  Isaac Bliss, Rayford Halsted, MD  PRIMARY REASON FOR VISIT:  1. Family history of breast cancer   2. Family history of ovarian cancer   3. Family history of uterine cancer    HISTORY OF PRESENT ILLNESS:   [name], a 54 y.o. female, was seen for a [facility] cancer genetics consultation at the request of Dr. Juleen China due to a family history of cancer.  Ms. Cocke presents to clinic today to discuss the possibility of a hereditary predisposition to cancer, to discuss genetic testing, and to further clarify her future cancer risks, as well as potential cancer risks for family members.    Ms. Redwine is a 54 y.o. female with no personal history of cancer.    RISK FACTORS:  Menarche was at age 53.  First live birth at age 60.  OCP use for approximately 10 years.  Ovaries intact: yes.  Hysterectomy: partial hysterectomy in November 2006 due to fibroids.  HRT use: 0 years. Colonoscopy: most recent in 2019; to follow up in 3 years. Mammogram within the last year: yes. Number of breast biopsies: 3 in mid 30s; benign per patient Up to date with pelvic exams: yes. Any excessive radiation exposure in the past: no  Past Medical History:  Diagnosis Date  . Family history of breast cancer 08/30/2020  . Family history of ovarian cancer 08/30/2020  . Family history of uterine cancer 08/30/2020  . Knee pain, bilateral   . Morbid obesity ([place])     Past Surgical History:  Procedure Laterality Date  . ADRENALECTOMY     left  . BREAST BIOPSY Bilateral    9 or 10 years ago/ benign  . THYROIDECTOMY, PARTIAL      Social History   Socioeconomic History  . Marital status: Single    Spouse name: Not on file  . Number of children: Not on file  . Years of education: Not on file  . Highest education level: Not on file  Occupational History  . Not on file  Tobacco Use    . Smoking status: Never Smoker  . Smokeless tobacco: Never Used  Vaping Use  . Vaping Use: Never used  Substance and Sexual Activity  . Alcohol use: Yes    Comment: occasional; 1 drink/week  . Drug use: Never  . Sexual activity: Yes    Partners: Male  Other Topics Concern  . Not on file  Social History Narrative  . Not on file    FAMILY HISTORY:  We obtained a detailed, 4-generation family history.  Significant diagnoses are listed below: Family History  Problem Relation Age of Onset  . Breast cancer Mother 47  . Ovarian cancer Mother        dx early 32s  . Breast cancer Maternal Grandmother        dx late 11s  . Breast cancer Maternal Aunt        dx late 20s  . Uterine cancer Maternal Aunt        dx 46s  . Cancer Other        MGM's brother, unknown type  . Uterine cancer Maternal Aunt        dx 24s  . Cancer Other        MGM's sister, unknown type    Ms. Kees has one daughter, age 53.  Her brother is 28 years old and  does not have a cancer history.  Ms. Kohen mother had ovarian cancer diagnosed in her early 69s and breast cancer diagnosed at 58.  Ms. Kehres maternal half aunts (maternal grandmother's daughters) have a history of breast and uterine cancer.  Ms. Posch maternal grandmother was diagnosed with breast cancer in her late 38s and passed away at age 74.  Ms. Pizzolato maternal grandmother's siblings had a history of an unknown type of cancer.  No other maternal family history of cancer was reported.  Ms. Seng father is living at age 51 and does not have a cancer history.  No paternal family history of cancer was reported.   Ms. Hitch is Francene Boyers of previous family history of genetic testing for hereditary cancer risks. Patient's maternal ancestors are of African American descent, and paternal ancestors are of African American descent. There is no reported Ashkenazi Jewish ancestry. There is no known consanguinity.  GENETIC  COUNSELING ASSESSMENT: Ms. Lipsey is a 54 y.o. female with a family history of breast and ovarian cancer which is somewhat suggestive of a hereditary cancer syndrome, such as Hereditary Breast and Ovarian Cancer, and predisposition to cancer given the related diagnoses in multiple generations of the family. We, therefore, discussed and recommended the following at today's visit.   DISCUSSION: We discussed that 5 - 10% of cancer is hereditary, with most cases of hereditary breast cancer associated with mutations in BRCA1/2. There are other genes that can be associated with hereditary breast cancer syndromes.  Type of cancer risk and level of risk are gene-specific.  We discussed that testing is beneficial for several reasons, including knowing about other cancer risks, identifying potential screening and risk-reduction options that may be appropriate, and to understand if other family members could be at risk for cancer and allow them to undergo genetic testing.  We reviewed the characteristics, features and inheritance patterns of hereditary cancer syndromes. We also discussed genetic testing, including the appropriate family members to test, the process of testing, insurance coverage and turn-around-time for results. We discussed the implications of a negative, positive, carrier and/or variant of uncertain significant result. We discussed that negative results would be uninformative given that Ms. Dombkowski does not have a personal history of cancer. We recommended Ms. Rohleder pursue genetic testing for a panel that contains genes associated with breast, ovarian, and uterine.  Ms. Wyeth was offered a common hereditary cancer panel (47 genes) and an expanded pan-cancer panel (84 genes). Ms. Bowersox was informed of the benefits and limitations of each panel, including that expanded pan-cancer panels contain several preliminary evidence genes that do not have clear management guidelines at this point  in time.  We also discussed that as the number of genes included on a panel increases, the chances of variants of uncertain significance increases.  After considering the benefits and limitations of each gene panel, Ms. Kratky elected to have an expanded pan-cancer panel through [facility].  The Multi-Cancer + RNA Panel offered by [facility] includes sequencing and/or deletion/duplication analysis of the following 84 genes:  AIP*, ALK, APC*, ATM*, AXIN2*, BAP1*, BARD1*, BLM*, BMPR1A*, BRCA1*, BRCA2*, BRIP1*, CASR, CDC73*, CDH1*, CDK4, CDKN1B*, CDKN1C*, CDKN2A, CEBPA, CHEK2*, CTNNA1*, DICER1*, DIS3L2*, EGFR, EPCAM, FH*, FLCN*, GATA2*, GPC3, GREM1, HOXB13, HRAS, KIT, MAX*, MEN1*, MET, MITF, MLH1*, MSH2*, MSH3*, MSH6*, MUTYH*, NBN*, NF1*, NF2*, NTHL1*, PALB2*, PDGFRA, PHOX2B, PMS2*, POLD1*, POLE*, POT1*, PRKAR1A*, PTCH1*, PTEN*, RAD50*, RAD51C*, RAD51D*, RB1*, RECQL4, RET, RUNX1*, SDHA*, SDHAF2*, SDHB*, SDHC*, SDHD*, SMAD4*, SMARCA4*, SMARCB1*, SMARCE1*, STK11*, SUFU*, TERC, TERT, TMEM127*, Tp53*, TSC1*,  TSC2*, VHL*, WRN*, and WT1.  RNA analysis is performed for * genes.   Based on Ms. Guerrette's family history of breast and ovarian cancer, she meets medical criteria for genetic testing. Despite that she meets criteria, she may still have an out of pocket cost. We discussed that if her out of pocket cost for testing is over $100, the laboratory will call and confirm whether she wants to proceed with testing.  If the out of pocket cost of testing is less than $100 she will be billed by the genetic testing laboratory.   Based on the patient's family history, a statistical model, Tyrer-Cuzick, was used to estimate her risk of developing breast cancer. This estimates her lifetime risk of developing breast to be approximately 51.8%. This estimation does not consider any genetic testing results.  The patient's lifetime breast cancer risk is a preliminary estimate based on available information using one of several models  endorsed by the [place] (ACS). The ACS recommends consideration of breast MRI screening as an adjunct to mammography for patients at high risk (defined as 20% or greater lifetime risk).     The program assumes that there is a gene predisposing to breast cancer in addition to the BRCA1/2 genes. The woman's family history is used to calculate the likelihood of her carrying an adverse gene, which in turn affects her likelihood of developing breast cancer. The risks of developing breast cancer for the general population were taken from data on the first breast cancer diagnosis (ICD-10 code C50) in [place] area ([place]) between 2005-2009. The risk from family history (caused by the adverse genes) is modelled to fit the results in "Familial Breast and Ovarian Cancer: A [employer], [place]., American Journal of Epidemiology 2000, [phone]".  We discussed that some people do not want to undergo genetic testing due to fear of genetic discrimination.  A federal law called the Genetic Information Non-Discrimination Act (GINA) of 2008 helps protect individuals against genetic discrimination based on their genetic test results.  It impacts both health insurance and employment.  With health insurance, it protects against increased premiums, being kicked off insurance or being forced to take a test in order to be insured.  For employment it protects against hiring, firing and promoting decisions based on genetic test results.  GINA does not apply to those in the [employer], those who work for companies with less than 15 employees, and new life insurance or long-term disability insurance policies.  Health status due to a cancer diagnosis is not protected under GINA.  PLAN: After considering the risks, benefits, and limitations, Ms. Nix provided informed consent to pursue genetic testing and the blood sample was sent to [facility] for  analysis of the Multi-Cancer + RNA. Results should be available within approximately 3 weeks' time, at which point they will be disclosed by telephone to Ms. Grill, as will any additional recommendations warranted by these results. Ms. Roddy will receive a summary of her genetic counseling visit and a copy of her results once available. This information will also be available in Epic.   Based on Ms. Broadnax's family history, we recommended her mother, who was diagnosed with breast and ovarian cancer have genetic counseling and testing. Ms. Filsaime will let us know if we can be of any assistance in coordinating genetic counseling and/or testing for this family member.   Lastly, we encouraged Ms. Janney to remain in contact with cancer genetics annually so  that we can continuously update the family history and inform her of any changes in cancer genetics and testing that may be of benefit for this family.   Ms. Grunden questions were answered to her satisfaction today. Our contact information was provided should additional questions or concerns arise. Thank you for the referral and allowing [place] to share in the care of your patient.   Truman Aceituno M. Joette Catching, [place], [facility] Certified Film/video [email or web address] (P) [phone]   The patient was seen for a total of 35 minutes in face-to-face genetic counseling.  This patient was discussed with Drs. Magrinat, Lindi Adie and/or Burr Medico who agrees with the above.    _______________________________________________________________________ For Office Staff:  Number of people involved in session: 1 Was an Intern/ student involved with case: no

## 2020-09-05 ENCOUNTER — Encounter: Payer: Self-pay | Admitting: Internal Medicine

## 2020-09-05 DIAGNOSIS — R21 Rash and other nonspecific skin eruption: Secondary | ICD-10-CM

## 2020-10-02 ENCOUNTER — Encounter: Payer: Self-pay | Admitting: Genetic Counselor

## 2020-10-02 ENCOUNTER — Ambulatory Visit: Payer: Self-pay | Admitting: Genetic Counselor

## 2020-10-02 ENCOUNTER — Telehealth: Payer: Self-pay | Admitting: Genetic Counselor

## 2020-10-02 DIAGNOSIS — Z803 Family history of malignant neoplasm of breast: Secondary | ICD-10-CM

## 2020-10-02 DIAGNOSIS — Z1379 Encounter for other screening for genetic and chromosomal anomalies: Secondary | ICD-10-CM | POA: Insufficient documentation

## 2020-10-02 DIAGNOSIS — Z8049 Family history of malignant neoplasm of other genital organs: Secondary | ICD-10-CM

## 2020-10-02 DIAGNOSIS — Z8041 Family history of malignant neoplasm of ovary: Secondary | ICD-10-CM

## 2020-10-02 NOTE — Telephone Encounter (Signed)
Revealed negative genetic testing and variant of uncertain significance in BRCA2.  Discussed that we do not know why there is cancer in the family. It could be familial, due to a mutation she did not inherit, due to a different gene that we are not testing, or maybe our current technology may not be able to pick something up.  It will be important for her to keep in contact with genetics to keep up with whether additional testing may be needed.

## 2020-10-02 NOTE — Progress Notes (Signed)
HPI:  Pamela Medina was previously seen in the [facility] due to a family history of cancer and concerns regarding a hereditary predisposition to cancer. Please refer to our prior cancer genetics clinic note for more information regarding our discussion, assessment and recommendations, at the time. Pamela Medina recent genetic test results were disclosed to her, as were recommendations warranted by these results. These results and recommendations are discussed in more detail below.  Pamela Medina is a 54 y.o. female with no personal history of cancer.    FAMILY HISTORY:  We obtained a detailed, 4-generation family history.  Significant diagnoses are listed below: Family History  Problem Relation Age of Onset  . Breast cancer Mother 51  . Ovarian cancer Mother        dx early 67s  . Breast cancer Maternal Grandmother        dx late 26s  . Breast cancer Maternal Aunt        dx late 23s  . Uterine cancer Maternal Aunt        dx 48s  . Cancer Other        MGM's brother, unknown type  . Uterine cancer Maternal Aunt        dx 23s  . Cancer Other        MGM's sister, unknown type    Pamela Medina has one daughter, age 31.  Her brother is 36 years old and does not have a cancer history.  Pamela Medina mother had ovarian cancer diagnosed in her early 110s and breast cancer diagnosed at 21.  Pamela Medina maternal half aunts (maternal grandmother's daughters) have a history of breast and uterine cancer.  Pamela Medina maternal grandmother was diagnosed with breast cancer in her late 15s and passed away at age 52.  Pamela Medina maternal grandmother's siblings had a history of an unknown type of cancer.  No other maternal family history of cancer was reported.  Pamela Medina father is living at age 56 and does not have a cancer history.  No paternal family history of cancer was reported.   Pamela Medina is Pamela Medina of previous family history of genetic testing for  hereditary cancer risks. Patient's maternal ancestors are of African American descent, and paternal ancestors are of African American descent. There is no reported Ashkenazi Jewish ancestry. There is no known consanguinity.  GENETIC TEST RESULTS: Genetic testing reported out on October 01, 2020.  The [facility] Multi-Cancer +RNA Panel found no pathogenic mutations. The Multi-Cancer + RNA Panel offered by [facility] includes sequencing and/or deletion/duplication analysis of the following 84 genes:  AIP*, ALK, APC*, ATM*, AXIN2*, BAP1*, BARD1*, BLM*, BMPR1A*, BRCA1*, BRCA2*, BRIP1*, CASR, CDC73*, CDH1*, CDK4, CDKN1B*, CDKN1C*, CDKN2A, CEBPA, CHEK2*, CTNNA1*, DICER1*, DIS3L2*, EGFR, EPCAM, FH*, FLCN*, GATA2*, GPC3, GREM1, HOXB13, HRAS, KIT, MAX*, MEN1*, MET, MITF, MLH1*, MSH2*, MSH3*, MSH6*, MUTYH*, NBN*, NF1*, NF2*, NTHL1*, PALB2*, PDGFRA, PHOX2B, PMS2*, POLD1*, POLE*, POT1*, PRKAR1A*, PTCH1*, PTEN*, RAD50*, RAD51C*, RAD51D*, RB1*, RECQL4, RET, RUNX1*, SDHA*, SDHAF2*, SDHB*, SDHC*, SDHD*, SMAD4*, SMARCA4*, SMARCB1*, SMARCE1*, STK11*, SUFU*, TERC, TERT, TMEM127*, Tp53*, TSC1*, TSC2*, VHL*, WRN*, and WT1.  RNA analysis is performed for * genes.  The test report has been scanned into EPIC and is located under the Molecular Pathology section of the Results Review tab.  A portion of the result report is included below for reference.     We discussed with Pamela Medina that because current genetic testing is not perfect, it is possible there may be a gene mutation  in one of these genes that current testing cannot detect, but that chance is small.  We also discussed, that there could be another gene that has not yet been discovered, or that we have not yet tested, that is responsible for the cancer diagnoses in the family. It is also possible there is a hereditary cause for the cancer in the family that Pamela Medina did not inherit and therefore was not identified in her testing.  Therefore, it is important to remain in  touch with cancer genetics in the future so that we can continue to offer Pamela Medina the most up to date genetic testing.   Genetic testing did identify a variant of uncertain significance (VUS) was identified in the BRCA2 gene called [id number] ([id number]).  At this time, it is unknown if this variant is associated with increased cancer risk or if this is a normal finding, but most variants such as this get reclassified to being inconsequential. It should not be used to make medical management decisions. With time, we suspect the lab will determine the significance of this variant, if any. If we do learn more about it, we will try to contact Pamela Medina to discuss it further. However, it is important to stay in touch with [place] periodically and keep the address and phone number up to date.  ADDITIONAL GENETIC TESTING: We discussed with Pamela Medina that her genetic testing was fairly extensive.  If there are genes identified to increase cancer risk that can be analyzed in the future, we would be happy to discuss and coordinate this testing at that time.    CANCER SCREENING RECOMMENDATIONS: Pamela Medina test result is considered negative (normal).  This means that we have not identified a hereditary cause for her family history of cancer at this time.  Given Pamela Medina's family histories, we must interpret these negative results with some caution.  Families with features suggestive of hereditary risk for cancer tend to have multiple family members with cancer, diagnoses in multiple generations and diagnoses before the age of 55. Ms. Samarin family exhibits some of these features. Thus, this result may simply reflect our current inability to detect all mutations within these genes or there may be a different gene that has not yet been discovered or tested.  Furthermore, it is possible that Ms. Verdejo's family history can be explained by a familial mutation she did not inherit.   An  individual's cancer risk and medical management are not determined by genetic test results alone. Overall cancer risk assessment incorporates additional factors, including personal medical history, family history, and any available genetic information that may result in a personalized plan for cancer prevention and surveillance.  Based on Ms. Whisnant's family of cancer, as well as her genetic test results, statistical models (Tyrer-Cuzick)  and literature data were used to estimate her risk of developing breast cancer. These estimate her lifetime risk of developing breast cancer to be approximately 41.% .  The patient's lifetime breast cancer risk is a preliminary estimate based on available information using one of several models endorsed by the [place] (ACS). The ACS recommends consideration of breast MRI screening as an adjunct to mammography for patients at high risk (defined as 20% or greater lifetime risk).    We, therefore, discussed that it is reasonable for Ms. Goyer to be followed by a high-risk breast cancer clinic; in addition to a yearly mammogram and physical exam by a healthcare provider, she should discuss the usefulness of an  annual breast MRI with the high-risk clinic providers.  RECOMMENDATIONS FOR FAMILY MEMBERS:  Individuals in this family might be at some increased risk of developing cancer, over the general population risk, simply due to the family history of cancer.  We recommended women in this family have a yearly mammogram beginning at age 34, or 72 years younger than the earliest onset of cancer, an annual clinical breast exam, and perform monthly breast self-exams. Women in this family should also have a gynecological exam as recommended by their primary provider. All family members should be referred for colonoscopy starting at age 65.  It is also possible there is a hereditary cause for the cancer in Ms. Obeso's family that she did not inherit and  therefore was not identified in her.  Based on Ms. Acri's family history, we recommended her mother, who was diagnosed with both breast and ovarian cancer, have genetic counseling and testing. Ms. Clingan will let us know if we can be of any assistance in coordinating genetic counseling and/or testing for this family member.   FOLLOW-UP: Lastly, we discussed with Ms. Lack that cancer genetics is a rapidly advancing field and it is possible that new genetic tests will be appropriate for her and/or her family members in the future. We encouraged her to remain in contact with cancer genetics on an annual basis so we can update her personal and family histories and let her know of advances in cancer genetics that may benefit this family.   Our contact number was provided. Ms. Scannell questions were answered to her satisfaction, and she knows she is welcome to call us at anytime with additional questions or concerns.   Cari M. Joette Catching, [place], [facility] Certified Film/video [email or web address] (P) [phone]

## 2020-10-05 ENCOUNTER — Telehealth: Payer: Self-pay | Admitting: Genetic Counselor

## 2020-10-05 NOTE — Telephone Encounter (Signed)
I received a staff msg to schedule Pamela Medina for the high risk breast clinic. I cld and lft the pt a vm to schedule an appt.

## 2020-10-28 ENCOUNTER — Telehealth: Payer: Commercial Managed Care - PPO | Admitting: Family

## 2020-10-28 DIAGNOSIS — J069 Acute upper respiratory infection, unspecified: Secondary | ICD-10-CM

## 2020-10-28 MED ORDER — BENZONATATE 100 MG PO CAPS: 100.0000 mg | ORAL_CAPSULE | Freq: Two times a day (BID) | ORAL | 0 refills | Status: DC | PRN

## 2020-10-28 NOTE — Progress Notes (Signed)
We are sorry you are not feeling well.  Here is how we plan to help!  Based on what you have shared with me, it looks like you may have a viral upper respiratory infection.  Upper respiratory infections are caused by a large number of viruses; however, rhinovirus is the most common cause.   Symptoms vary from person to person, with common symptoms including sore throat, cough, fatigue or lack of energy and feeling of general discomfort.  A low-grade fever of up to 100.4 may present, but is often uncommon.  Symptoms vary however, and are closely related to a person's age or underlying illnesses.  The most common symptoms associated with an upper respiratory infection are nasal discharge or congestion, cough, sneezing, headache and pressure in the ears and face.  These symptoms usually persist for about 3 to 10 days, but can last up to 2 weeks.  It is important to know that upper respiratory infections do not cause serious illness or complications in most cases.    Upper respiratory infections can be transmitted from person to person, with the most common method of transmission being a person's hands.  The virus is able to live on the skin and can infect other persons for up to 2 hours after direct contact.  Also, these can be transmitted when someone coughs or sneezes; thus, it is important to cover the mouth to reduce this risk.  To keep the spread of the illness at bay, good hand hygiene is very important.  This is an infection that is most likely caused by a virus. There are no specific treatments other than to help you with the symptoms until the infection runs its course.  We are sorry you are not feeling well.  Here is how we plan to help!   For nasal congestion, you may use an oral decongestants such as Mucinex D or if you have glaucoma or high blood pressure use plain Mucinex.  Saline nasal spray or nasal drops can help and can safely be used as often as needed for congestion.    If you do not  have a history of heart disease, hypertension, diabetes or thyroid disease, prostate/bladder issues or glaucoma, you may also use Sudafed to treat nasal congestion.  It is highly recommended that you consult with a pharmacist or your primary care physician to ensure this medication is safe for you to take.     If you have a cough, you may use cough suppressants such as Delsym and Robitussin.  If you have glaucoma or high blood pressure, you can also use Coricidin HBP.   For cough I have prescribed for you A prescription cough medication called Tessalon Perles 100 mg. You may take 1-2 capsules every 8 hours as needed for cough  If you have a sore or scratchy throat, use a saltwater gargle-  to  teaspoon of salt dissolved in a 4-ounce to 8-ounce glass of warm water.  Gargle the solution for approximately 15-30 seconds and then spit.  It is important not to swallow the solution.  You can also use throat lozenges/cough drops and Chloraseptic spray to help with throat pain or discomfort.  Warm or cold liquids can also be helpful in relieving throat pain.  For headache, pain or general discomfort, you can use Ibuprofen or Tylenol as directed.   Some authorities believe that zinc sprays or the use of Echinacea may shorten the course of your symptoms.   HOME CARE . Only take medications  as instructed by your medical team. . Be sure to drink plenty of fluids. Water is fine as well as fruit juices, sodas and electrolyte beverages. You may want to stay away from caffeine or alcohol. If you are nauseated, try taking small sips of liquids. How do you know if you are getting enough fluid? Your urine should be a pale yellow or almost colorless. . Get rest. . Taking a steamy shower or using a humidifier may help nasal congestion and ease sore throat pain. You can place a towel over your head and breathe in the steam from hot water coming from a faucet. . Using a saline nasal spray works much the same way. . Cough  drops, hard candies and sore throat lozenges may ease your cough. . Avoid close contacts especially the very young and the elderly . Cover your mouth if you cough or sneeze . Always remember to wash your hands.   GET HELP RIGHT AWAY IF: . You develop worsening fever. . If your symptoms do not improve within 10 days . You develop yellow or green discharge from your nose over 3 days. . You have coughing fits . You develop a severe head ache or visual changes. . You develop shortness of breath, difficulty breathing or start having chest pain . Your symptoms persist after you have completed your treatment plan  MAKE SURE YOU   Understand these instructions.  Will watch your condition.  Will get help right away if you are not doing well or get worse.  Your e-visit answers were reviewed by a board certified advanced clinical practitioner to complete your personal care plan. Depending upon the condition, your plan could have included both over the counter or prescription medications. Please review your pharmacy choice. If there is a problem, you may call our nursing hot line at and have the prescription routed to another pharmacy. Your safety is important to [place]. If you have drug allergies check your prescription carefully.   You can use MyChart to ask questions about today's visit, request a non-urgent call back, or ask for a work or school excuse for 24 hours related to this e-Visit. If it has been greater than 24 hours you will need to follow up with your provider, or enter a new e-Visit to address those concerns. You will get an e-mail in the next two days asking about your experience.  I hope that your e-visit has been valuable and will speed your recovery. Thank you for using e-visits.  Greater than 5 minutes, yet less than 10 minutes of time have been spent researching, coordinating, and implementing care for this patient today.  Thank you for the details you included in the comment  boxes. Those details are very helpful in determining the best course of treatment for you and help [place] to provide the best care.

## 2020-11-16 ENCOUNTER — Other Ambulatory Visit: Payer: Self-pay | Admitting: Family

## 2021-01-04 ENCOUNTER — Other Ambulatory Visit: Payer: Self-pay

## 2021-01-04 ENCOUNTER — Encounter (HOSPITAL_COMMUNITY): Payer: Self-pay

## 2021-01-04 ENCOUNTER — Ambulatory Visit (HOSPITAL_COMMUNITY)
Admission: RE | Admit: 2021-01-04 | Discharge: 2021-01-04 | Disposition: A | Discharge status: Home / Self Care | Payer: Commercial Managed Care - PPO | Source: Ambulatory Visit | Attending: Emergency Medicine | Admitting: Emergency Medicine

## 2021-01-04 VITALS — BP 133/88 | HR 74 | Temp 97.9°F | Resp 20

## 2021-01-04 DIAGNOSIS — H66001 Acute suppurative otitis media without spontaneous rupture of ear drum, right ear: Secondary | ICD-10-CM

## 2021-01-04 DIAGNOSIS — R040 Epistaxis: Secondary | ICD-10-CM

## 2021-01-04 MED ORDER — CEFDINIR 300 MG PO CAPS: 300.0000 mg | ORAL_CAPSULE | Freq: Two times a day (BID) | ORAL | 0 refills | Status: AC

## 2021-01-04 NOTE — Discharge Instructions (Signed)
5 days of antibiotics for your right ear. Drink plenty of water to keep any secretions thin.  Start an antihistamine such as zyrtec or claritin.  Use of nasal saline throughout the day to moisturize your nose. If nose bleeds seem to be under control you can start daily flonase to further help with ear pressure.  If symptoms worsen or do not improve in the next week to return to be seen or to follow up with your PCP.

## 2021-01-04 NOTE — ED Provider Notes (Signed)
[facility]    CSN: [id number] Arrival date & time: 01/04/21  1338      History   Chief Complaint Chief Complaint  Patient presents with  . Otalgia  . Epistaxis    HPI Pamela Medina is a 55 y.o. female.   Pamela Medina presents with complaints of right ear pressure which started 1 week ago, and is now painful. Feels like it needs to pop but won't. Now a sharp pain to the ear, worse with chewing etc. No dental pain. No fevers. A few days ago noted eye redness, without discharge. The past two days has woken up with brief epistaxis. No current bleeding. No nasal drainage. History of allergies.   ROS per HPI, negative if not otherwise mentioned.      Past Medical History:  Diagnosis Date  . Family history of breast cancer 08/30/2020  . Family history of ovarian cancer 08/30/2020  . Family history of uterine cancer 08/30/2020  . Knee pain, bilateral   . Morbid obesity [facility])     Patient Active Problem List   Diagnosis Date Noted  . Genetic testing 10/02/2020  . Family history of breast cancer 08/30/2020  . Family history of ovarian cancer 08/30/2020  . Family history of uterine cancer 08/30/2020  . Vitamin D deficiency 08/24/2020  . Abnormal TSH 08/24/2020  . History of partial hysterectomy 08/07/2020  . Morbid obesity (HCC)   . Knee pain, bilateral     Past Surgical History:  Procedure Laterality Date  . ADRENALECTOMY     left  . BREAST BIOPSY Bilateral    9 or 10 years ago/ benign  . THYROIDECTOMY, PARTIAL      OB History    Gravida  2   Para      Term      Preterm      AB  2   Living  1     SAB  2   IAB      Ectopic      Multiple      Live Births               Home Medications    Prior to Admission medications   Medication Sig Start Date End Date Taking? Authorizing Provider  cefdinir (OMNICEF) 300 MG capsule Take 1 capsule (300 mg total) by mouth 2 (two) times daily for 5 days. 01/04/21 01/09/21 Yes Abdulhadi Stopa, Barron Alvine, NP  benzonatate (TESSALON) 100 MG capsule Take 1 capsule (100 mg total) by mouth 2 (two) times daily as needed for cough. 10/28/20   Eulis Foster, FNP    Family History Family History  Problem Relation Age of Onset  . Breast cancer Mother 77  . Ovarian cancer Mother        dx early 77s  . Breast cancer Maternal Grandmother        dx late 72s  . Breast cancer Maternal Aunt        dx late 78s  . Uterine cancer Maternal Aunt        dx 32s  . Cancer Other        MGM's brother, unknown type  . Uterine cancer Maternal Aunt        dx 69s  . Cancer Other        MGM's sister, unknown type    Social History Social History   Tobacco Use  . Smoking status: Never Smoker  . Smokeless tobacco: Never Used  Vaping Use  .  Vaping Use: Never used  Substance Use Topics  . Alcohol use: Yes    Comment: occasional  . Drug use: Never     Allergies   Ciprofloxacin, Other, and Penicillins   Review of Systems Review of Systems   Physical Exam Triage Vital Signs ED Triage Vitals  Enc Vitals Group     BP 01/04/21 1353 133/88     Pulse Rate 01/04/21 1353 74     Resp 01/04/21 1353 20     Temp 01/04/21 1353 97.9 F (36.6 C)     Temp src --      SpO2 01/04/21 1353 96 %     Weight --      Height --      Head Circumference --      Peak Flow --      Pain Score 01/04/21 1352 6     Pain Loc --      Pain Edu? --      Excl. in GC? --    No data found.  Updated Vital Signs BP 133/88   Pulse 74   Temp 97.9 F (36.6 C)   Resp 20   SpO2 96%   Visual Acuity Right Eye Distance:   Left Eye Distance:   Bilateral Distance:    Right Eye Near:   Left Eye Near:    Bilateral Near:     Physical Exam Constitutional:      General: She is not in acute distress.    Appearance: She is well-developed.  HENT:     Right Ear: A middle ear effusion is present. Tympanic membrane is erythematous.     Left Ear: Tympanic membrane and ear canal normal.     Ears:     Comments: Effusion  with mild redness noted to right TM    Nose:     Comments: Dried blood noted to bilateral nares, R>L without active bleeding; no other nasal drainage Cardiovascular:     Rate and Rhythm: Normal rate.  Pulmonary:     Effort: Pulmonary effort is normal.  Skin:    General: Skin is warm and dry.  Neurological:     Mental Status: She is alert and oriented to person, place, and time.      UC Treatments / Results  Labs (all labs ordered are listed, but only abnormal results are displayed) Labs Reviewed - No data to display  EKG   Radiology No results found.  Procedures Procedures (including critical care time)  Medications Ordered in UC Medications - No data to display  Initial Impression / Assessment and Plan / UC Course  I have reviewed the triage vital signs and the nursing notes.  Pertinent labs & imaging results that were available during my care of the patient were reviewed by me and considered in my medical decision making (see chart for details).     Mild AOM to right TM on exam. Symptoms likely allergic in origin with treatment recommendations provided. Return precautions provided. Patient verbalized understanding and agreeable to plan.   Final Clinical Impressions(s) / UC Diagnoses   Final diagnoses:  Epistaxis  Acute suppurative otitis media of right ear without spontaneous rupture of tympanic membrane, recurrence not specified     Discharge Instructions     5 days of antibiotics for your right ear. Drink plenty of water to keep any secretions thin.  Start an antihistamine such as zyrtec or claritin.  Use of nasal saline throughout the day to moisturize your nose. If  nose bleeds seem to be under control you can start daily flonase to further help with ear pressure.  If symptoms worsen or do not improve in the next week to return to be seen or to follow up with your PCP.     ED Prescriptions    Medication Sig Dispense Auth. Provider   cefdinir (OMNICEF)  300 MG capsule Take 1 capsule (300 mg total) by mouth 2 (two) times daily for 5 days. 10 capsule Georgetta Haber, NP     PDMP not reviewed this encounter.   Georgetta Haber, NP 01/04/21 1450

## 2021-01-04 NOTE — ED Triage Notes (Signed)
Pt in with c/o right ear pain and 2 nosebleeds in the past 24 hours   Pt states it feels like her ear is clogged and it won't pop

## 2021-01-08 ENCOUNTER — Encounter: Payer: Self-pay | Admitting: Internal Medicine

## 2021-01-11 ENCOUNTER — Encounter: Payer: Self-pay | Admitting: Internal Medicine

## 2021-01-11 ENCOUNTER — Other Ambulatory Visit: Payer: Self-pay

## 2021-01-11 ENCOUNTER — Ambulatory Visit: Payer: Commercial Managed Care - PPO | Admitting: Internal Medicine

## 2021-01-11 ENCOUNTER — Other Ambulatory Visit: Payer: Self-pay | Admitting: Internal Medicine

## 2021-01-11 VITALS — BP 128/80 | HR 66 | Temp 98.1°F | Ht 69.75 in | Wt 313.0 lb

## 2021-01-11 DIAGNOSIS — H9201 Otalgia, right ear: Secondary | ICD-10-CM

## 2021-01-11 DIAGNOSIS — R7989 Other specified abnormal findings of blood chemistry: Secondary | ICD-10-CM | POA: Diagnosis not present

## 2021-01-11 DIAGNOSIS — E559 Vitamin D deficiency, unspecified: Secondary | ICD-10-CM | POA: Diagnosis not present

## 2021-01-11 LAB — T4, FREE: Free T4: 1.02 ng/dL (ref 0.60–1.60)

## 2021-01-11 LAB — VITAMIN D 25 HYDROXY (VIT D DEFICIENCY, FRACTURES): VITD: 16.05 ng/mL — ABNORMAL LOW (ref 30.00–100.00)

## 2021-01-11 LAB — T3, FREE: T3, Free: 3.9 pg/mL (ref 2.3–4.2)

## 2021-01-11 LAB — TSH: TSH: 5.3 u[IU]/mL — ABNORMAL HIGH (ref 0.35–4.50)

## 2021-01-11 MED ORDER — VITAMIN D (ERGOCALCIFEROL) 1.25 MG (50000 UNIT) PO CAPS: 50000.0000 [IU] | ORAL_CAPSULE | ORAL | 0 refills | Status: AC

## 2021-01-11 NOTE — Progress Notes (Signed)
Acute office Visit     This visit occurred during the SARS-CoV-2 public health emergency.  Safety protocols were in place, including screening questions prior to the visit, additional usage of staff PPE, and extensive cleaning of exam room while observing appropriate contact time as indicated for disinfecting solutions.    CC/Reason for Visit: Continued right ear pain, needs labs  HPI: Pamela Medina is a 55 y.o. female who is coming in today for the above mentioned reasons.  She was seen at urgent care on March 4 due to right ear pain.  She was diagnosed with right acute otitis media and given 5 days of cefdinir.  Her problem started a week before her urgent care visit.  She has not had any URI symptoms, maybe a little bit of a stuffy nose.  She did have a negative Covid PCR per report.  She has had 2 COVID vaccines.  Despite antibiotic therapy she still feels like her balance is off and a little stuffy.  She was found to have an elevated TSH and is here to have that rechecked today.  Also needs her vitamin D levels rechecked after completing high-dose vitamin D supplementation.  Past Medical/Surgical History: Past Medical History:  Diagnosis Date  . Family history of breast cancer 08/30/2020  . Family history of ovarian cancer 08/30/2020  . Family history of uterine cancer 08/30/2020  . Knee pain, bilateral   . Morbid obesity (HCC)     Past Surgical History:  Procedure Laterality Date  . ADRENALECTOMY     left  . BREAST BIOPSY Bilateral    9 or 10 years ago/ benign  . THYROIDECTOMY, PARTIAL      Social History:  reports that she has never smoked. She has never used smokeless tobacco. She reports current alcohol use. She reports that she does not use drugs.  Allergies: Allergies  Allergen Reactions  . Ciprofloxacin Hives and Nausea And Vomiting  . Other     Tree Nuts  Fresh fruit  . Penicillins Hives and Nausea Only    Family History:  Family History  Problem  Relation Age of Onset  . Breast cancer Mother 60  . Ovarian cancer Mother        dx early 53s  . Breast cancer Maternal Grandmother        dx late 26s  . Breast cancer Maternal Aunt        dx late 79s  . Uterine cancer Maternal Aunt        dx 57s  . Cancer Other        MGM's brother, unknown type  . Uterine cancer Maternal Aunt        dx 54s  . Cancer Other        MGM's sister, unknown type    No current outpatient medications on file.  Review of Systems:  Constitutional: Denies fever, chills, diaphoresis, appetite change and fatigue.  HEENT: Denies photophobia, eye pain, redness, hearing loss,  mouth sores, trouble swallowing, neck pain, neck stiffness and tinnitus.   Respiratory: Denies SOB, DOE, cough, chest tightness,  and wheezing.   Cardiovascular: Denies chest pain, palpitations and leg swelling.  Gastrointestinal: Denies nausea, vomiting, abdominal pain, diarrhea, constipation, blood in stool and abdominal distention.  Genitourinary: Denies dysuria, urgency, frequency, hematuria, flank pain and difficulty urinating.  Endocrine: Denies: hot or cold intolerance, sweats, changes in hair or nails, polyuria, polydipsia. Musculoskeletal: Denies myalgias, back pain, joint swelling, arthralgias and gait problem.  Skin: Denies pallor, rash and wound.  Neurological: Denies dizziness, seizures, syncope, weakness, light-headedness, numbness and headaches.  Hematological: Denies adenopathy. Easy bruising, personal or family bleeding history  Psychiatric/Behavioral: Denies suicidal ideation, mood changes, confusion, nervousness, sleep disturbance and agitation    Physical Exam: Vitals:   01/11/21 0730  BP: 128/80  Pulse: 66  Temp: 98.1 F (36.7 C)  TempSrc: Oral  SpO2: 99%  Weight: (!) 313 lb (142 kg)  Height: 5' 9.75" (1.772 m)    Body mass index is 45.23 kg/m.   Constitutional: NAD, calm, comfortable Eyes: PERRL, lids and conjunctivae normal ENMT: Mucous membranes  are moist.  Tympanic membrane bilaterally is pearly white with some fluid evident behind both tympanic membranes, no erythema, no apparent purulence. Neck: normal, supple, no masses, no thyromegaly Neurologic: Grossly intact and nonfocal Psychiatric: Normal judgment and insight. Alert and oriented x 3. Normal mood.    Impression and Plan:  Vitamin D deficiency -Recheck vitamin D levels.  Abnormal TSH  -She has been diagnosed with possibly subclinical hypothyroidism, recheck thyroid function tests today.  Right ear pain -No sign of infection today, do not believe another antibiotic course is necessary. -She does have fluid behind the TM.  I have advised Mucinex D twice daily for 7 to 10 days.     Chaya Jan, MD [facility]

## 2021-01-11 NOTE — Addendum Note (Signed)
Addended by: Leonette Nutting on: 01/11/2021 07:42 AM   Modules accepted: Orders

## 2021-01-11 NOTE — Addendum Note (Signed)
Addended by: Leonette Nutting on: 01/11/2021 07:53 AM   Modules accepted: Orders

## 2021-01-15 ENCOUNTER — Other Ambulatory Visit: Payer: Self-pay | Admitting: Internal Medicine

## 2021-01-15 DIAGNOSIS — E559 Vitamin D deficiency, unspecified: Secondary | ICD-10-CM

## 2021-01-15 DIAGNOSIS — R7989 Other specified abnormal findings of blood chemistry: Secondary | ICD-10-CM

## 2021-01-15 NOTE — Progress Notes (Signed)
v

## 2021-02-04 ENCOUNTER — Encounter: Payer: Self-pay | Admitting: Internal Medicine

## 2021-02-04 DIAGNOSIS — R7989 Other specified abnormal findings of blood chemistry: Secondary | ICD-10-CM

## 2021-03-27 ENCOUNTER — Ambulatory Visit: Payer: Commercial Managed Care - PPO | Admitting: Endocrinology

## 2021-03-27 ENCOUNTER — Encounter: Payer: Self-pay | Admitting: Endocrinology

## 2021-03-27 ENCOUNTER — Other Ambulatory Visit: Payer: Self-pay

## 2021-03-27 VITALS — BP 130/80 | HR 73 | Ht 70.5 in | Wt 322.8 lb

## 2021-03-27 DIAGNOSIS — E041 Nontoxic single thyroid nodule: Secondary | ICD-10-CM

## 2021-03-27 DIAGNOSIS — E890 Postprocedural hypothyroidism: Secondary | ICD-10-CM | POA: Diagnosis not present

## 2021-03-27 MED ORDER — LEVOTHYROXINE SODIUM 50 MCG PO TABS: 50.0000 ug | ORAL_TABLET | Freq: Every day | ORAL | 3 refills | Status: DC

## 2021-03-27 NOTE — Progress Notes (Signed)
Subjective:    Patient ID: Pamela Medina, female    DOB: 1966/05/24, 55 y.o.   MRN: [id number]  HPI Pt is referred by Dr Feliz Beam, for hypothyroidism.  Pt had left thyroid lobect in 2015, in [place].  She says pathol was benign.  she has never been on prescribed thyroid hormone therapy.  she has never taken kelp or any other type of non-prescribed thyroid product.  she has never had XRT to the neck.  she has never been on amiodarone or lithium.  She reports weight gain, arthralgias, constipation, mild depression, dry skin, and fatigue.   Past Medical History:  Diagnosis Date  . Family history of breast cancer 08/30/2020  . Family history of ovarian cancer 08/30/2020  . Family history of uterine cancer 08/30/2020  . Knee pain, bilateral   . Morbid obesity (HCC)     Past Surgical History:  Procedure Laterality Date  . ADRENALECTOMY     left  . BREAST BIOPSY Bilateral    9 or 10 years ago/ benign  . THYROIDECTOMY, PARTIAL      Social History   Socioeconomic History  . Marital status: Single    Spouse name: Not on file  . Number of children: Not on file  . Years of education: Not on file  . Highest education level: Not on file  Occupational History  . Not on file  Tobacco Use  . Smoking status: Never Smoker  . Smokeless tobacco: Never Used  Vaping Use  . Vaping Use: Never used  Substance and Sexual Activity  . Alcohol use: Yes    Comment: occasional  . Drug use: Never  . Sexual activity: Yes    Partners: Male  Other Topics Concern  . Not on file  Social History Narrative  . Not on file   Social Determinants of Health   Financial Resource Strain: Not on file  Food Insecurity: Not on file  Transportation Needs: Not on file  Physical Activity: Not on file  Stress: Not on file  Social Connections: Not on file  Intimate Partner Violence: Not on file    Current Outpatient Medications on File Prior to Visit  Medication Sig Dispense Refill  . Vitamin D, Ergocalciferol,  (DRISDOL) 1.25 MG (50000 UNIT) CAPS capsule Take 1 capsule (50,000 Units total) by mouth every 7 (seven) days for 12 doses. 12 capsule 0   No current facility-administered medications on file prior to visit.    Allergies  Allergen Reactions  . Ciprofloxacin Hives and Nausea And Vomiting  . Other     Tree Nuts  Fresh fruit  . Penicillins Hives and Nausea Only    Family History  Problem Relation Age of Onset  . Breast cancer Mother 67  . Ovarian cancer Mother        dx early 64s  . Breast cancer Maternal Grandmother        dx late 109s  . Breast cancer Maternal Aunt        dx late 53s  . Uterine cancer Maternal Aunt        dx 18s  . Cancer Other        MGM's brother, unknown type  . Uterine cancer Maternal Aunt        dx 50s  . Cancer Other        MGM's sister, unknown type  . Thyroid disease Neg Hx     BP 130/80 (BP Location: Right Arm, Patient Position: Bed low/side rails up, Cuff Size:  Normal)   Pulse 73   Ht 5' 10.5" (1.791 m)   Wt (!) 322 lb 12.8 oz (146.4 kg)   SpO2 97%   BMI 45.66 kg/m    Review of Systems denies depression, numbness, and cold intolerance.       Objective:   Physical Exam VS: see vs page GEN: no distress HEAD: head: no deformity eyes: no periorbital swelling, no proptosis external nose and ears are normal a healed scar is present.  I do not appreciate a nodule in the thyroid or elsewhere in the neck CHEST WALL: no deformity LUNGS: clear to auscultation CV: reg rate and rhythm, no murmur.  MUSCULOSKELETAL: gait is normal and steady EXTEMITIES: no deformity.  no leg edema NEURO:  readily moves all 4's.  sensation is intact to touch on all 4's SKIN:  Normal texture and temperature.  No rash or suspicious lesion is visible.   NODES:  None palpable at the neck.   PSYCH: alert, well-oriented.  Does not appear anxious nor depressed.     Lab Results  Component Value Date   TSH 5.30 (H) 01/11/2021      Assessment & Plan:   Hypothyroidism, new to me.  Uncontrolled.  We discussed.  In view of h/o thyroid nodule and sxs, I advised medication even at this TSH Thyr nodule, by hx.  Pt requests f/u US.  Patient Instructions  I have sent a prescription to your pharmacy, for a thyroid hormone pill.   It is best to never miss the medication.  However, if you do miss it, next best is to double up the next time.   Please recheck the blood tests in 4-6 weeks.   Let's recheck the ultrasound.  you will receive a phone call, about a day and time for an appointment.   I would be happy to see you back here as needed.

## 2021-03-27 NOTE — Patient Instructions (Addendum)
I have sent a prescription to your pharmacy, for a thyroid hormone pill.   It is best to never miss the medication.  However, if you do miss it, next best is to double up the next time.   Please recheck the blood tests in 4-6 weeks.   Let's recheck the ultrasound.  you will receive a phone call, about a day and time for an appointment.   I would be happy to see you back here as needed.

## 2021-04-05 ENCOUNTER — Other Ambulatory Visit: Payer: Self-pay

## 2021-04-05 ENCOUNTER — Encounter: Payer: Self-pay | Admitting: Internal Medicine

## 2021-04-05 ENCOUNTER — Ambulatory Visit: Payer: Commercial Managed Care - PPO | Admitting: Internal Medicine

## 2021-04-05 VITALS — BP 128/82 | HR 77 | Ht 70.0 in | Wt 318.2 lb

## 2021-04-05 DIAGNOSIS — E559 Vitamin D deficiency, unspecified: Secondary | ICD-10-CM | POA: Diagnosis not present

## 2021-04-05 DIAGNOSIS — E890 Postprocedural hypothyroidism: Secondary | ICD-10-CM

## 2021-04-05 DIAGNOSIS — E896 Postprocedural adrenocortical (-medullary) hypofunction: Secondary | ICD-10-CM | POA: Diagnosis not present

## 2021-04-05 MED ORDER — LEVOTHYROXINE SODIUM 50 MCG PO TABS: 50.0000 ug | ORAL_TABLET | Freq: Every day | ORAL | 3 refills | Status: DC

## 2021-04-05 NOTE — Progress Notes (Signed)
Name: Pamela Medina  MRN/ DOB: [id number], [birth year]    Age/ Sex: 55 y.o., female     PCP: Philip Aspen, Limmie Patricia, MD   Reason for Endocrinology Evaluation: Hypothyroidism     Initial Endocrinology Clinic Visit: 03/27/2021    PATIENT IDENTIFIER: [name] is a 55 y.o., female with a past medical history of MNG. She has followed with [facility] since 03/27/2021 for consultative assistance with management of her  MND   HISTORICAL SUMMARY:   Patient has an incidental finding of a left thyroid nodule on CT scan, per patient she had an FNA with benign cytology in 2017 in [place] . She is S/P left lobectomy due to 6 cm nodule, benign pathology   She has been noted with a slight elevation of the TSH  For the past 2-3 yrs, in 08/2020  Had a TSH of 5.55 you IU/mL in 08/2020, this has been confirmed in 01/2021.  She was offered LT-4 replacement by her previous endocrinologist.  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 Does not recall hormonal checks     She switched care from Dr. Everardo All to me by 04/2021   No Fh of thyroid and adrenal disease    SUBJECTIVE:    Today (04/05/2021):  Pamela Medina is here for subclinical hypothyroidism .   Her weight continues to increase  And unable to lose weight  Has gained 25 lbs in the past 1.5 yrs   Has chronic constipation  Has depression , not on treatment  Denies local neck symptoms  Does not sleep well at night , she has fatigue  She has pending thyroid ultrasound      HISTORY:  Past Medical History:  Past Medical History:  Diagnosis Date  . Family history of breast cancer 08/30/2020  . Family history of ovarian cancer 08/30/2020  . Family history of uterine cancer 08/30/2020  . Knee pain, bilateral   . Morbid obesity (HCC)    Past Surgical History:  Past Surgical History:  Procedure Laterality Date  . ADRENALECTOMY     left  . BREAST BIOPSY Bilateral    9 or 10 years ago/  benign  . THYROIDECTOMY, PARTIAL      Social History:  reports that she has never smoked. She has never used smokeless tobacco. She reports current alcohol use. She reports that she does not use drugs. Family History:  Family History  Problem Relation Age of Onset  . Breast cancer Mother 45  . Ovarian cancer Mother        dx early 14s  . Breast cancer Maternal Grandmother        dx late 26s  . Breast cancer Maternal Aunt        dx late 51s  . Uterine cancer Maternal Aunt        dx 32s  . Cancer Other        MGM's brother, unknown type  . Uterine cancer Maternal Aunt        dx 66s  . Cancer Other        MGM's sister, unknown type  . Thyroid disease Neg Hx      HOME MEDICATIONS: Allergies as of 04/05/2021      Reactions   Ciprofloxacin Hives, Nausea And Vomiting   Other    Tree Nuts Fresh fruit   Penicillins Hives, Nausea Only      Medication List       Accurate as of April 05, 2021  4:17 PM. If you have any questions, ask your nurse or doctor.        levothyroxine 50 MCG tablet Commonly known as: SYNTHROID Take 1 tablet (50 mcg total) by mouth daily.         OBJECTIVE:   PHYSICAL EXAM: VS: BP 128/82   Pulse 77   Ht 5\' 10"  (1.778 m)   Wt (!) 318 lb 4 oz (144.4 kg)   SpO2 98%   BMI 45.66 kg/m    EXAM: General: Pt appears well and is in NAD  Eyes: External eye exam normal without stare, lid lag or exophthalmos.  EOM intact.   Neck: General: Supple without adenopathy. Thyroid:  No goiter or nodules appreciated.   Lungs: Clear with good BS bilat with no rales, rhonchi, or wheezes  Heart: Auscultation: RRR.  Abdomen: Normoactive bowel sounds, soft, nontender, without masses or organomegaly palpable  Extremities:  BL LE: No pretibial edema normal ROM and strength.  Skin: Hair: Texture and amount normal with gender appropriate distribution Skin Inspection: No rashes Skin Palpation: Skin temperature, texture, and thickness normal to palpation  Neuro:  Cranial nerves: II - XII grossly intact  Motor: Normal strength throughout DTRs: 2+ and symmetric in UE without delay in relaxation phase  Mental Status: Judgment, insight: Intact Orientation: Oriented to time, place, and person Mood and affect: No depression, anxiety, or agitation     DATA REVIEWED:  Results for SAMANTHIA, HOWLAND (MRN [name]) as of 04/08/2021 12:30  Ref. Range 01/11/2021 07:51  TSH Latest Ref Range: 0.35 - 4.50 uIU/mL 5.30 (H)  Triiodothyronine,Free,Serum Latest Ref Range: 2.3 - 4.2 pg/mL 3.9  T4,Free(Direct) Latest Ref Range: 0.60 - 1.60 ng/dL 03/13/2021     ASSESSMENT / PLAN / RECOMMENDATIONS:   1. Subclinical Hypothyroidism , S/P left lobectomy :   - We discussed pathophysiology of subclinical hypothyroidism,pt with non -specific symptoms, she agreed to a trial of LT-4 replacement  - Pt educated extensively on the correct way to take levothyroxine (first thing in the morning with water, 30 minutes before eating or taking other medications). - Pt encouraged to double dose the following day if she were to miss a dose given long half-life of levothyroxine.    Medications   Start Levothyroxine 50 mcg daily    2. Hx of left Lobectomy:    - Due to benign reasons  - Has pending ultrasound    3. Hx of left adrenalectomy :   - Does not recall hormonal check up  - Will proceed with aldo: renin , cortisol and metanephrines    4. Hx of vitamin D deficiency :    - Will check today   Labs in 8 weeks    I spent 25 minutes preparing to see the patient by review of recent labs, imaging and procedures, obtaining and reviewing separately obtained history, communicating with the patient/family or caregiver, ordering medications, tests or procedures, and documenting clinical information in the EHR including the differential Dx, treatment, and any further evaluation and other management    Signed electronically by: 2.68, MD  [facility]  Endocrinology  [facility] [address] [place] [place], [place] Phone: [phone] FAX: [phone]      CC: [phone], Philip Aspen, MD [address] [place] Phone: [phone]  Fax: [phone]   Return to Endocrinology clinic as below: No future appointments.

## 2021-04-05 NOTE — Patient Instructions (Addendum)
24-Hour Urine Collection   You will be collecting your urine for a 24-hour period of time.  Your timer starts with your first urine of the morning (For example - If you first pee at 9AM, your timer will start at 9AM)  Throw away your first urine of the morning  Collect your urine every time you pee for the next 24 hours STOP your urine collection 24 hours after you started the collection (For example - You would stop at 9AM the day after you started)    You are on levothyroxine - which is your thyroid hormone supplement. You MUST take this consistently.  You should take this first thing in the morning on an empty stomach with water. You should not take it with other medications. Wait to 1hr prior to eating. If you are taking any vitamins - please take these in the evening.   If you miss a dose, please take your missed dose the following day (double the dose for that day). You should have a pill box for ONLY levothyroxine on your bedside table to help you remember to take your medications.

## 2021-04-10 LAB — SPECIMEN STATUS REPORT

## 2021-04-15 ENCOUNTER — Other Ambulatory Visit: Payer: Self-pay | Admitting: Internal Medicine

## 2021-04-15 MED ORDER — VITAMIN D (ERGOCALCIFEROL) 1.25 MG (50000 UNIT) PO CAPS: 50000.0000 [IU] | ORAL_CAPSULE | ORAL | 0 refills | Status: DC

## 2021-04-18 LAB — VITAMIN D 25 HYDROXY (VIT D DEFICIENCY, FRACTURES): Vit D, 25-Hydroxy: 18.3 ng/mL — ABNORMAL LOW (ref 30.0–100.0)

## 2021-04-18 LAB — ALDOSTERONE + RENIN ACTIVITY W/ RATIO
ALDOS/RENIN RATIO: 9.9 (ref 0.0–30.0)
ALDOSTERONE: 3 ng/dL (ref 0.0–30.0)
Renin: 0.304 ng/mL/hr (ref 0.167–5.380)

## 2021-04-18 LAB — POTASSIUM: Potassium: 3.7 mmol/L (ref 3.5–5.2)

## 2021-04-24 ENCOUNTER — Ambulatory Visit
Admission: RE | Admit: 2021-04-24 | Discharge: 2021-04-24 | Disposition: A | Discharge status: Home / Self Care | Payer: Commercial Managed Care - PPO | Source: Ambulatory Visit | Attending: Endocrinology | Admitting: Endocrinology

## 2021-04-24 ENCOUNTER — Other Ambulatory Visit: Payer: Self-pay

## 2021-04-24 DIAGNOSIS — E041 Nontoxic single thyroid nodule: Secondary | ICD-10-CM

## 2021-04-27 ENCOUNTER — Encounter: Payer: Self-pay | Admitting: Internal Medicine

## 2021-05-21 ENCOUNTER — Encounter: Payer: Self-pay | Admitting: Internal Medicine

## 2021-06-04 ENCOUNTER — Other Ambulatory Visit: Payer: Commercial Managed Care - PPO

## 2021-06-04 ENCOUNTER — Other Ambulatory Visit: Payer: Self-pay

## 2021-06-04 DIAGNOSIS — E896 Postprocedural adrenocortical (-medullary) hypofunction: Secondary | ICD-10-CM

## 2021-06-05 LAB — CREATININE, URINE, 24 HOUR
Creatinine, 24H Ur: 1676 mg/24 hr (ref 800–1800)
Creatinine, Urine: 88.2 mg/dL

## 2021-06-11 ENCOUNTER — Encounter: Payer: Self-pay | Admitting: Nurse Practitioner

## 2021-06-13 LAB — METANEPHRINES, URINE, 24 HOUR
Metaneph Total, Ur: 33 ug/L
Metanephrines, 24H Ur: 63 ug/24 hr (ref 36–209)
Normetanephrine, 24H Ur: 486 ug/24 hr (ref 131–612)
Normetanephrine, Ur: 256 ug/L

## 2021-06-13 LAB — CORTISOL, URINE, FREE
Cortisol (Ur), Free: 23 ug/24 hr (ref 6–42)
Cortisol,F,ug/L,U: 12 ug/L

## 2021-06-19 ENCOUNTER — Other Ambulatory Visit: Payer: Self-pay | Admitting: Internal Medicine

## 2021-06-19 DIAGNOSIS — Z1231 Encounter for screening mammogram for malignant neoplasm of breast: Secondary | ICD-10-CM

## 2021-06-20 ENCOUNTER — Ambulatory Visit: Payer: Self-pay

## 2021-06-20 ENCOUNTER — Encounter: Payer: Self-pay | Admitting: Family Medicine

## 2021-06-20 ENCOUNTER — Ambulatory Visit: Payer: Commercial Managed Care - PPO | Admitting: Family Medicine

## 2021-06-20 ENCOUNTER — Other Ambulatory Visit: Payer: Self-pay

## 2021-06-20 ENCOUNTER — Ambulatory Visit (INDEPENDENT_AMBULATORY_CARE_PROVIDER_SITE_OTHER): Payer: Commercial Managed Care - PPO

## 2021-06-20 VITALS — BP 110/78 | HR 64 | Ht 70.0 in | Wt 318.8 lb

## 2021-06-20 DIAGNOSIS — M7711 Lateral epicondylitis, right elbow: Secondary | ICD-10-CM

## 2021-06-20 DIAGNOSIS — M7989 Other specified soft tissue disorders: Secondary | ICD-10-CM | POA: Diagnosis not present

## 2021-06-20 DIAGNOSIS — M79631 Pain in right forearm: Secondary | ICD-10-CM | POA: Diagnosis not present

## 2021-06-20 DIAGNOSIS — M25562 Pain in left knee: Secondary | ICD-10-CM

## 2021-06-20 DIAGNOSIS — M25561 Pain in right knee: Secondary | ICD-10-CM

## 2021-06-20 DIAGNOSIS — G8929 Other chronic pain: Secondary | ICD-10-CM

## 2021-06-20 NOTE — Progress Notes (Signed)
I, Christoper Fabian, LAT, ATC, am serving as scribe for Dr. [name].  Pamela Medina is a 55 y.o. female who presents to [facility] today for knee and R wrist and R index finger pain.  She was last seen by Dr. Denyse Amass on 07/17/20 for B knee pain and had B knee injections.  Today, pt reports that her knee injections helped through Jan/Feb 2022 and knee pain has been progressively worsening since then.  She is now having pain transitioning from sit-to-stand and climbing stairs.  She also notes R calf swelling and notes issues w/ her veins.  R wrist pain:  Pain and swollen/raised area x 6-7 months w/ no known MOI -Swelling: yes -Aggravating factors: R wrist ext AROM; typing -Treatments tried: Ice, heat, Aleve  Pt also c/o R index finger pain at PIP and DIP, particularly w/ index finger flexion at these joints.  Diagnostic imaging: R and L knee XR- 07/05/20  Pertinent review of systems: No fevers or chills  Relevant historical information: Hypothyroidism   Exam:  BP 110/78 (BP Location: Left Arm, Patient Position: Sitting, Cuff Size: Large)   Pulse 64   Ht 5\' 10"  (1.778 m)   Wt (!) 318 lb 12.8 oz (144.6 kg)   SpO2 98%   BMI 45.74 kg/m  General: Well Developed, well nourished, and in no acute distress.   MSK: Right wrist minimal swelling dorsal wrist compared to left. Tender palpation right dorsal wrist.  Tender palpation right lateral elbow at lateral epicondyle. Normal wrist and elbow motion. Some pain with resisted finger extension felt in the dorsal distal wrist and at the right lateral elbow. Right second digit normal-appearing mildly tender palpation DIP and PIP.  Normal motion.  Normal strength.  Knees bilaterally mild effusion normal motion with crepitation.    Lab and Radiology Results  Diagnostic Limited MSK Ultrasound of: Right dorsal wrist and hand elbow and right second digit Right dorsal wrist: First dorsal wrist compartment  visualized with small amount of hypoechoic fluid tracking around tendon sheath consistent with tenosynovitis.  Tendon structures otherwise normal-appearing Bony structures otherwise normal-appearing Right lateral epicondyle visualized.  Common extensor tendon normal onto origin site. Small avulsion fleck present at superficial portion of lateral epicondyle consistent with lateral epicondylitis.  No visible tear present common extensor tendon. Right second digit DIP normal-appearing Right second digit PIP slight effusion with tiny hyperechoic dot in the midportion of the joint dorsally unclear etiology possible tiny avulsion. Impression:  Right dorsal wrist for dorsal wrist compartment tenosynovitis. Right lateral elbow lateral epicondylitis. Right second digit synovitis DIP PIP  Procedure: Real-time Ultrasound Guided Injection of right knee superior lateral patellar space Device: Philips Affiniti 50G Images permanently stored and available for review in PACS Verbal informed consent obtained.  Discussed risks and benefits of procedure. Warned about infection bleeding damage to structures skin hypopigmentation and fat atrophy among others. Patient expresses understanding and agreement Time-out conducted.   Noted no overlying erythema, induration, or other signs of local infection.   Skin prepped in a sterile fashion.   Local anesthesia: Topical Ethyl chloride.   With sterile technique and under real time ultrasound guidance: 40 mg of Kenalog and 2 mL of Marcaine injected into knee joint. Fluid seen entering the joint capsule.   Completed without difficulty   Pain immediately resolved suggesting accurate placement of the medication.   Advised to call if fevers/chills, erythema, induration, drainage, or persistent bleeding.   Images permanently stored and available for  review in the ultrasound unit.  Impression: Technically successful ultrasound guided injection.    Procedure: Real-time  Ultrasound Guided Injection of left knee superior lateral patellar space Device: Philips Affiniti 50G Images permanently stored and available for review in PACS Verbal informed consent obtained.  Discussed risks and benefits of procedure. Warned about infection bleeding damage to structures skin hypopigmentation and fat atrophy among others. Patient expresses understanding and agreement Time-out conducted.   Noted no overlying erythema, induration, or other signs of local infection.   Skin prepped in a sterile fashion.   Local anesthesia: Topical Ethyl chloride.   With sterile technique and under real time ultrasound guidance: 40 mg of Kenalog and 2 mL of Marcaine injected into knee joint. Fluid seen entering the joint capsule.   Completed without difficulty   Pain immediately resolved suggesting accurate placement of the medication.   Advised to call if fevers/chills, erythema, induration, drainage, or persistent bleeding.   Images permanently stored and available for review in the ultrasound unit.  Impression: Technically successful ultrasound guided injection.    X-ray images right hand obtained today personally and independently interpreted No acute fractures.  Minimal degenerative changes wrist. Await formal radiology review  EXAM: RIGHT KNEE - COMPLETE 4+ VIEW   COMPARISON:  None.   FINDINGS: The sunrise view is mildly limited. The mineralization and alignment are normal. There is no evidence of acute fracture or dislocation. There are mild tricompartmental degenerative changes. Probable medial varicosities. No joint effusion or other focal soft tissue abnormalities identified.   IMPRESSION: Mild tricompartmental degenerative changes. No acute osseous findings.     Electronically Signed   By: Carey Bullocks M.D.   On: 07/06/2020 15:08  EXAM: LEFT KNEE - COMPLETE 4+ VIEW   COMPARISON:  None.   FINDINGS: The sunrise view is limited. The mineralization and  alignment are normal. There is no evidence of acute fracture or dislocation. There are mild tricompartmental degenerative changes. No joint effusion or foreign body.   IMPRESSION: No acute osseous findings. Mild tricompartmental degenerative changes.     Electronically Signed   By: Carey Bullocks M.D.   On: 07/06/2020 15:07   I, [name], personally (independently) visualized and performed the interpretation of the images attached in this note.    Assessment and Plan: 55 y.o. female with  Right dorsal wrist and lateral elbow pain consistent with tenosynovitis of the extensor tendons into the hand at the fourth dorsal wrist compartment and lateral epicondylitis.  Plan for home exercise program for the structures taught in clinic today by ATC.  If not improving hand PT would be Treatment for this.  Patient will let me know and I will place referral.  Pain and swelling second digit PIP and DIP.  Unclear etiology thought to be possible irritated synovitis or traumatic synovitis.  Home exercise program and hand PT reviewed in clinic today by me.  Recommend Voltaren gel and heating pad and home hand therapy with mild incline.  Again if not doing well she will let me know and I will refer to hand PT which would be the ideal treatment next step.  Bilateral knee pain.  Exacerbation of underlying DJD previously identified.  Plan for bilateral steroid injections.  Certainly can repeat this every 3 months as needed or proceed to hyaluronic acid injections as needed in the future.     PDMP not reviewed this encounter. Orders Placed This Encounter  Procedures   [place] LIMITED JOINT SPACE STRUCTURES UP RIGHT(NO LINKED CHARGES)  Order Specific Question:   Reason for Exam (SYMPTOM  OR DIAGNOSIS REQUIRED)    Answer:   R forearm pain    Order Specific Question:   Preferred imaging location?    Answer:   [facility] Hand Complete Right    Standing Status:   Future     Number of Occurrences:   1    Standing Expiration Date:   06/20/2022    Order Specific Question:   Reason for Exam (SYMPTOM  OR DIAGNOSIS REQUIRED)    Answer:   right hand pain    Order Specific Question:   Preferred imaging location?    Answer:   [facility]    Order Specific Question:   Is patient pregnant?    Answer:   No   No orders of the defined types were placed in this encounter.    Discussed warning signs or symptoms. Please see discharge instructions. Patient expresses understanding.   The above documentation has been reviewed and is accurate and complete [name], M.D.

## 2021-06-20 NOTE — Patient Instructions (Addendum)
Good to see you today.    Please get an Xray today before you leave .  You had steroid injection in both of your knees today.  Call or go to the ER if you develop a large red swollen joint with extreme pain or oozing puss.   Please perform the exercise program that we have prepared for you and gone over in detail on a daily basis.  In addition to the handout you were provided you can access your program through: [email or web address]   Your unique program code is:  : [email or web address]  Do the exercises for your hand w/ modeling clay like we discussed.  If you don't feel like you're getting better after a month of doing your home exercises, let me know and I'll order formal PT.

## 2021-06-25 NOTE — Progress Notes (Signed)
Right hand x-ray shows no acute fracture.

## 2021-06-26 DIAGNOSIS — Z1231 Encounter for screening mammogram for malignant neoplasm of breast: Secondary | ICD-10-CM

## 2021-07-26 ENCOUNTER — Other Ambulatory Visit: Payer: Self-pay | Admitting: Internal Medicine

## 2021-07-26 DIAGNOSIS — Z1231 Encounter for screening mammogram for malignant neoplasm of breast: Secondary | ICD-10-CM

## 2021-07-27 ENCOUNTER — Ambulatory Visit (HOSPITAL_COMMUNITY): Admit: 2021-07-27 | Discharge status: Absent without leave | Payer: Commercial Managed Care - PPO

## 2021-08-01 ENCOUNTER — Ambulatory Visit: Payer: Commercial Managed Care - PPO | Admitting: Nurse Practitioner

## 2021-08-05 ENCOUNTER — Ambulatory Visit: Payer: Commercial Managed Care - PPO | Admitting: Internal Medicine

## 2021-08-05 ENCOUNTER — Other Ambulatory Visit: Payer: Self-pay

## 2021-08-05 ENCOUNTER — Encounter: Payer: Self-pay | Admitting: Internal Medicine

## 2021-08-05 VITALS — BP 124/82 | HR 70 | Ht 70.0 in | Wt 316.8 lb

## 2021-08-05 DIAGNOSIS — R5383 Other fatigue: Secondary | ICD-10-CM | POA: Diagnosis not present

## 2021-08-05 DIAGNOSIS — E559 Vitamin D deficiency, unspecified: Secondary | ICD-10-CM

## 2021-08-05 DIAGNOSIS — E890 Postprocedural hypothyroidism: Secondary | ICD-10-CM

## 2021-08-05 LAB — TSH: TSH: 1.94 u[IU]/mL (ref 0.35–5.50)

## 2021-08-05 LAB — VITAMIN D 25 HYDROXY (VIT D DEFICIENCY, FRACTURES): VITD: 21.01 ng/mL — ABNORMAL LOW (ref 30.00–100.00)

## 2021-08-05 NOTE — Patient Instructions (Signed)

## 2021-08-05 NOTE — Progress Notes (Signed)
Name: Pamela Medina  MRN/ DOB: [id number], [birth year]    Age/ Sex: 55 y.o., female     PCP: Pamela Medina, Pamela Patricia, MD   Reason for Endocrinology Evaluation: Hypothyroidism     Initial Endocrinology Clinic Visit: 03/27/2021    PATIENT IDENTIFIER: Pamela Medina is a 55 y.o., female with a past medical history of MNG. She has followed with [facility] since 03/27/2021 for consultative assistance with management of her  MND   HISTORICAL SUMMARY:   Patient has an incidental finding of a left thyroid nodule on CT scan, per patient she had an FNA with benign cytology in 2017 in [place] . She is S/P left lobectomy due to 6 cm nodule, benign pathology   She has been noted with a slight elevation of the TSH  For the past 2-3 yrs, in 08/2020  Had a TSH of 5.55 you IU/mL in 08/2020, this has been confirmed in 01/2021.  She was offered LT-4 replacement by her previous endocrinologist.  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  Unknown hormonal status prior to surgery.  Aldo, renin, catecholamines and 24-hour urinary cortisol have all come back normal 06/2021  She switched care from Dr. Everardo All to me by 04/2021   No Fh of thyroid and adrenal disease    SUBJECTIVE:    Today (08/05/2021):  Pamela Medina is here for subclinical hypothyroidism .   She has been noted with weight loss   Has chronic constipation  Denies local neck symptoms  She continues with fatigue  due to hot flashes   Has appointment with Gyn next week   Levothyroxine 50 mcg daily  Vitamin D 2000 iu daily     HISTORY:  Past Medical History:  Past Medical History:  Diagnosis Date   Family history of breast cancer 08/30/2020   Family history of ovarian cancer 08/30/2020   Family history of uterine cancer 08/30/2020   Knee pain, bilateral    Morbid obesity (HCC)    Past Surgical History:  Past  Surgical History:  Procedure Laterality Date   ADRENALECTOMY     left   BREAST BIOPSY Bilateral    9 or 10 years ago/ benign   THYROIDECTOMY, PARTIAL     Social History:  reports that she has never smoked. She has never used smokeless tobacco. She reports current alcohol use. She reports that she does not use drugs. Family History:  Family History  Problem Relation Age of Onset   Breast cancer Mother 29   Ovarian cancer Mother        dx early 94s   Breast cancer Maternal Grandmother        dx late 74s   Breast cancer Maternal Aunt        dx late 41s   Uterine cancer Maternal Aunt        dx 66s   Cancer Other        MGM's brother, unknown type   Uterine cancer Maternal Aunt        dx 66s   Cancer Other        MGM's sister, unknown type   Thyroid disease Neg Hx      HOME MEDICATIONS: Allergies as of 08/05/2021       Reactions   Ciprofloxacin Hives, Nausea And Vomiting   Other    Tree Nuts Fresh fruit   Penicillins Hives, Nausea Only  Medication List        Accurate as of August 05, 2021  3:53 PM. If you have any questions, ask your nurse or doctor.          cholecalciferol 25 MCG (1000 UNIT) tablet Commonly known as: VITAMIN D3 Take 1,000 Units by mouth daily.   levothyroxine 50 MCG tablet Commonly known as: SYNTHROID Take 1 tablet (50 mcg total) by mouth daily.   Vitamin D (Ergocalciferol) 1.25 MG (50000 UNIT) Caps capsule Commonly known as: DRISDOL Take 1 capsule (50,000 Units total) by mouth every 7 (seven) days.          OBJECTIVE:   PHYSICAL EXAM: VS: BP 124/82 (BP Location: Left Arm, Patient Position: Sitting, Cuff Size: Large)   Pulse 70   Ht 5\' 10"  (1.778 m)   Wt (!) 316 lb 12.8 oz (143.7 kg)   SpO2 98%   BMI 45.46 kg/m    EXAM: General: Pt appears well and is in NAD  Neck: General: Supple without adenopathy. Thyroid:  No goiter or nodules appreciated.   Lungs: Clear with good BS bilat with no rales, rhonchi, or wheezes   Heart: Auscultation: RRR.  Abdomen: Normoactive bowel sounds, soft, nontender, without masses or organomegaly palpable  Extremities:  BL LE: No pretibial edema normal ROM and strength.  Mental Status: Judgment, insight: Intact Orientation: Oriented to time, place, and person Mood and affect: No depression, anxiety, or agitation     DATA REVIEWED:  Results for Pamela Medina (MRN Pamela Medina) as of 08/06/2021 10:27  Ref. Range 08/05/2021 16:05  TSH Latest Ref Range: 0.35 - 5.50 uIU/mL 1.94   Thyroid Ultrasound 04/24/2021   Nodule # 1:   Location: Right; Mid   Maximum size: 0.6 cm; Other 2 dimensions: 0.6 x 0.5 cm   Composition: solid/almost completely solid (2)   Echogenicity: hypoechoic (2)   Shape: not taller-than-wide (0)   Margins: ill-defined (0)   Echogenic foci: none (0)   ACR TI-RADS total points: 4.   ACR TI-RADS risk category: TR4 (4-6 points).   ACR TI-RADS recommendations:   Given size (<0.9 cm) and appearance, this nodule does NOT meet TI-RADS criteria for biopsy or dedicated follow-up.   _________________________________________________________   Nodule # 2:   Location: Right; Inferior   Maximum size: 1.1 cm; Other 2 dimensions: 0.6 x 0.6 cm   Composition: cystic/almost completely cystic (0)   Echogenicity: anechoic (0)   Shape: not taller-than-wide (0)   Margins: smooth (0)   Echogenic foci: none (0)   ACR TI-RADS total points: 0.   ACR TI-RADS risk category: TR1 (0-1 points).   ACR TI-RADS recommendations:   This nodule does NOT meet TI-RADS criteria for biopsy or dedicated follow-up.   _________________________________________________________   No cervical lymphadenopathy.   IMPRESSION: 1. Postsurgical changes after left hemithyroidectomy without evidence of local recurrence or cervical lymphadenopathy. 2. Benign-appearing nodules in the mid inferior right thyroid which do not warrant additional ultrasound follow-up or  tissue sampling.  ASSESSMENT / PLAN / RECOMMENDATIONS:   Subclinical Hypothyroidism , S/P left lobectomy :   - Pt continues with fatigue but her TSH is optimal .  - No changes at this time   Medications   Continue Levothyroxine 50 mcg daily    2. Hx of left Lobectomy:    - Due to benign reasons  -Ultrasound sound showed right thyroid nodules, non meets criteria for FNA or surveillance     3. Hx of vitamin D deficiency :    -  Vitamin D continues to be low, will restart Ergocalciferol as the OTC D 200 iu daily is not helping   Medication Ergocalciferol 50,000 iu weekly    4. Fatigue :  - We discussed normal TSH, we discussed other causes to include lack of proper sleep, anemia, OSA, Vitamin D deficiency or anxiety/mental exhaustion  - I have encouraged her to check with her PCP to see if she would qualify for OSA screening   F/U in 6 months     Signed electronically by: Lyndle Herrlich, MD  [facility] End[facility]inology  [facility] [address] [place] [place], [place] [address] Phone: [phone] FAX: [phone]      CC: Pamela Medina, Pamela Patricia, MD [address] [place] [address] Phone: [phone]  Fax: [phone]   Return to Endocrinology clinic as below: Future Appointments  Date Time Provider Department Center  08/12/2021  8:30 AM [facility] MM 2 [facility]  08/13/2021 11:30 AM Olivia Mackie, NP [facility] None  08/27/2021 10:30 AM Pamela Medina, Pamela Patricia, MD [facility]

## 2021-08-06 DIAGNOSIS — R5383 Other fatigue: Secondary | ICD-10-CM | POA: Insufficient documentation

## 2021-08-06 MED ORDER — VITAMIN D (ERGOCALCIFEROL) 1.25 MG (50000 UNIT) PO CAPS: 50000.0000 [IU] | ORAL_CAPSULE | ORAL | 3 refills | Status: DC

## 2021-08-06 MED ORDER — LEVOTHYROXINE SODIUM 50 MCG PO TABS: 50.0000 ug | ORAL_TABLET | Freq: Every day | ORAL | 3 refills | Status: DC

## 2021-08-07 ENCOUNTER — Other Ambulatory Visit: Payer: Self-pay | Admitting: Internal Medicine

## 2021-08-07 DIAGNOSIS — E559 Vitamin D deficiency, unspecified: Secondary | ICD-10-CM

## 2021-08-12 ENCOUNTER — Ambulatory Visit
Admission: RE | Admit: 2021-08-12 | Discharge: 2021-08-12 | Disposition: A | Discharge status: Home / Self Care | Payer: Commercial Managed Care - PPO | Source: Ambulatory Visit

## 2021-08-12 ENCOUNTER — Other Ambulatory Visit: Payer: Self-pay

## 2021-08-12 DIAGNOSIS — Z1231 Encounter for screening mammogram for malignant neoplasm of breast: Secondary | ICD-10-CM

## 2021-08-13 ENCOUNTER — Ambulatory Visit: Payer: Commercial Managed Care - PPO | Admitting: Nurse Practitioner

## 2021-08-27 ENCOUNTER — Encounter: Payer: Commercial Managed Care - PPO | Admitting: Internal Medicine

## 2021-10-01 ENCOUNTER — Other Ambulatory Visit: Payer: Self-pay

## 2021-10-01 ENCOUNTER — Ambulatory Visit (INDEPENDENT_AMBULATORY_CARE_PROVIDER_SITE_OTHER): Payer: Commercial Managed Care - PPO | Admitting: Nurse Practitioner

## 2021-10-01 ENCOUNTER — Encounter: Payer: Self-pay | Admitting: Nurse Practitioner

## 2021-10-01 VITALS — BP 122/74 | HR 76 | Ht 69.0 in | Wt 319.0 lb

## 2021-10-01 DIAGNOSIS — N951 Menopausal and female climacteric states: Secondary | ICD-10-CM

## 2021-10-01 DIAGNOSIS — Z01419 Encounter for gynecological examination (general) (routine) without abnormal findings: Secondary | ICD-10-CM | POA: Diagnosis not present

## 2021-10-01 DIAGNOSIS — Z113 Encounter for screening for infections with a predominantly sexual mode of transmission: Secondary | ICD-10-CM | POA: Diagnosis not present

## 2021-10-01 DIAGNOSIS — Z9189 Other specified personal risk factors, not elsewhere classified: Secondary | ICD-10-CM | POA: Diagnosis not present

## 2021-10-01 MED ORDER — VENLAFAXINE HCL ER 37.5 MG PO CP24
37.5000 mg | ORAL_CAPSULE | Freq: Every day | ORAL | 1 refills | Status: DC
Start: 2021-10-01 — End: 2022-01-09

## 2021-10-01 NOTE — Progress Notes (Signed)
Pamela Medina [birth year] [id number]   History:  55 y.o. G2P0021 presents for annual exam. 2006 partial hysterectomy due to fibroids. Has had night sweats for years but feels they are worse now. She has been evaluated by endocrinology who recommended discussing with GYN. History of partial thyroidectomy, managed with levothyroxine. Normal pap and mammogram history. Significant family history of female cancers - mother with history of breast (age 61) and ovarian cancer (age 34), MGM with breast cancer (55s), and two maternal aunts with breast and uterine cancer. Saw genetics after visit last year and was negative for any mutations but does have a lifetime breast cancer risk of 41%.   Gynecologic History No LMP recorded. Patient has had a hysterectomy.   Contraception/Family planning: status post hysterectomy Sexually active: Yes  Health Maintenance Last Pap: 08/07/2020. Results were: Normal, 5-year repeat Last mammogram: 08/12/2021. Results were: Normal Last colonoscopy: 2019. Results were: Polyps, 3-year recall Last Dexa: Not indicated  Past medical history, past surgical history, family history and social history were all reviewed and documented in the EPIC chart. Works in [employer] for [employer]. 64 yo daughter.   ROS:  A ROS was performed and pertinent positives and negatives are included.  Exam:  Vitals:   10/01/21 1454  BP: 122/74  Pulse: 76  SpO2: 93%  Weight: (!) 319 lb (144.7 kg)  Height: _0  (1.753 m)    Body mass index is 47.11 kg/m.  General appearance:  Normal Thyroid:  Symmetrical, normal in size, without palpable masses or nodularity. Respiratory  Auscultation:  Clear without wheezing or rhonchi Cardiovascular  Auscultation:  Regular rate, without rubs, murmurs or gallops  Edema/varicosities:  Not grossly evident Abdominal  Soft,nontender, without masses, guarding or rebound.  Liver/spleen:  No organomegaly noted  Hernia:  None appreciated   Skin  Inspection:  Grossly normal   Breasts: Examined lying and sitting.   Right: Without masses, retractions, discharge or axillary adenopathy.   Left: Without masses, retractions, discharge or axillary adenopathy. Genitourinary   Inguinal/mons:  Normal without inguinal adenopathy  External genitalia:  Normal appearing vulva with no masses, tenderness, or lesions  BUS/Urethra/Skene's glands:  Normal  Vagina:  Normal appearing with normal color and discharge, no lesions  Cervix:  Normal appearing without discharge or lesions  Uterus:  Difficult to palpate (partial hyst) but no gross masses or tenderness  Adnexa/parametria:     Rt: Normal in size, without masses or tenderness.   Lt: Normal in size, without masses or tenderness.  Anus and perineum: Normal  Digital rectal exam: Normal sphincter tone without palpated masses or tenderness  Patient informed chaperone available to be present for breast and pelvic exam. Patient has requested no chaperone to be present. Patient has been advised what will be completed during breast and pelvic exam.    Assessment/Plan:  55 y.o. [id number] as new patient.    Well female exam with routine gynecological exam - Education provided on SBEs, importance of preventative screenings, current guidelines, high calcium diet, regular exercise, and multivitamin daily. Labs with PCP and endocrinology.   At high risk for breast cancer - Lifetime breast cancer risk of 41%, BRCA negative but significant family history. Recommend annual breast MRI in April 2023 (6 months after mammogram). Will send referral.   Vasomotor symptoms due to menopause - Plan: venlafaxine XR (EFFEXOR XR) 37.5 MG 24 hr capsule. Has had night sweats for years but feels they are worse now. Has seen endocrinology with normal thyroid evaluation (partial thyroidectomy, stable  on Levothyroxine). Due to family history of cancer we will avoid HRT and she is understanding. She would like to try SNRI. She  is aware of proper use and initial side effects that may occur.   Screen for STD (sexually transmitted disease) - Plan: SURESWAB CT/NG/T. vaginalis  Screening for cervical cancer - Normal pap history. Will repeat at 5-year interval per guidelines.   Screening for colon cancer - 2019 colonoscopy. Overdue and plans to schedule soon.   Follow up in 1 year for annual       [place], 3:28 PM 10/01/2021

## 2021-10-03 LAB — SURESWAB CT/NG/T. VAGINALIS
C. trachomatis RNA, TMA: NOT DETECTED
N. gonorrhoeae RNA, TMA: NOT DETECTED
Trichomonas vaginalis RNA: NOT DETECTED

## 2021-10-30 ENCOUNTER — Encounter: Payer: Self-pay | Admitting: Internal Medicine

## 2021-10-30 ENCOUNTER — Ambulatory Visit (INDEPENDENT_AMBULATORY_CARE_PROVIDER_SITE_OTHER): Payer: Commercial Managed Care - PPO | Admitting: Internal Medicine

## 2021-10-30 ENCOUNTER — Other Ambulatory Visit: Payer: Self-pay

## 2021-10-30 VITALS — BP 124/84 | HR 80 | Temp 97.6°F | Ht 69.0 in | Wt 314.4 lb

## 2021-10-30 DIAGNOSIS — Z1211 Encounter for screening for malignant neoplasm of colon: Secondary | ICD-10-CM

## 2021-10-30 DIAGNOSIS — Z0000 Encounter for general adult medical examination without abnormal findings: Secondary | ICD-10-CM

## 2021-10-30 DIAGNOSIS — M25562 Pain in left knee: Secondary | ICD-10-CM

## 2021-10-30 DIAGNOSIS — M25561 Pain in right knee: Secondary | ICD-10-CM | POA: Diagnosis not present

## 2021-10-30 DIAGNOSIS — E559 Vitamin D deficiency, unspecified: Secondary | ICD-10-CM | POA: Diagnosis not present

## 2021-10-30 DIAGNOSIS — G4733 Obstructive sleep apnea (adult) (pediatric): Secondary | ICD-10-CM

## 2021-10-30 DIAGNOSIS — E890 Postprocedural hypothyroidism: Secondary | ICD-10-CM

## 2021-10-30 LAB — LIPID PANEL
Cholesterol: 183 mg/dL (ref 0–200)
HDL: 48.7 mg/dL (ref >39.00)
LDL Cholesterol: 123 mg/dL — ABNORMAL HIGH (ref 0–99)
NonHDL: 134.03
Total CHOL/HDL Ratio: 4
Triglycerides: 54 mg/dL (ref 0.0–149.0)
VLDL: 10.8 mg/dL (ref 0.0–40.0)

## 2021-10-30 LAB — CBC WITH DIFFERENTIAL/PLATELET
Basophils Absolute: 0 10*3/uL (ref 0.0–0.1)
Basophils Relative: 0.3 % (ref 0.0–3.0)
Eosinophils Absolute: 0.1 10*3/uL (ref 0.0–0.7)
Eosinophils Relative: 1.5 % (ref 0.0–5.0)
HCT: 40.5 % (ref 36.0–46.0)
Hemoglobin: 13.2 g/dL (ref 12.0–15.0)
Lymphocytes Relative: 34.8 % (ref 12.0–46.0)
Lymphs Abs: 2.4 10*3/uL (ref 0.7–4.0)
MCHC: 32.5 g/dL (ref 30.0–36.0)
MCV: 90.8 fl (ref 78.0–100.0)
Monocytes Absolute: 0.4 10*3/uL (ref 0.1–1.0)
Monocytes Relative: 6.3 % (ref 3.0–12.0)
Neutro Abs: 4 10*3/uL (ref 1.4–7.7)
Neutrophils Relative %: 57.1 % (ref 43.0–77.0)
Platelets: 323 10*3/uL (ref 150.0–400.0)
RBC: 4.46 Mil/uL (ref 3.87–5.11)
RDW: 13.9 % (ref 11.5–15.5)
WBC: 6.9 10*3/uL (ref 4.0–10.5)

## 2021-10-30 LAB — COMPREHENSIVE METABOLIC PANEL
ALT: 13 U/L (ref 0–35)
AST: 14 U/L (ref 0–37)
Albumin: 4 g/dL (ref 3.5–5.2)
Alkaline Phosphatase: 96 U/L (ref 39–117)
BUN: 13 mg/dL (ref 6–23)
CO2: 29 mEq/L (ref 19–32)
Calcium: 9.5 mg/dL (ref 8.4–10.5)
Chloride: 102 mEq/L (ref 96–112)
Creatinine, Ser: 0.95 mg/dL (ref 0.40–1.20)
GFR: 68.42 mL/min (ref >60.00)
Glucose, Bld: 108 mg/dL — ABNORMAL HIGH (ref 70–99)
Potassium: 3.9 mEq/L (ref 3.5–5.1)
Sodium: 140 mEq/L (ref 135–145)
Total Bilirubin: 0.6 mg/dL (ref 0.2–1.2)
Total Protein: 7.7 g/dL (ref 6.0–8.3)

## 2021-10-30 LAB — VITAMIN D 25 HYDROXY (VIT D DEFICIENCY, FRACTURES): VITD: 21.24 ng/mL — ABNORMAL LOW (ref 30.00–100.00)

## 2021-10-30 LAB — VITAMIN B12: Vitamin B-12: 399 pg/mL (ref 211–911)

## 2021-10-30 LAB — HEMOGLOBIN A1C: Hgb A1c MFr Bld: 5.4 % (ref 4.6–6.5)

## 2021-10-30 NOTE — Patient Instructions (Signed)
-  Nice seeing you today!!  -Lab work today; will notify you once results are available.  -Remember your flu, COVID booster and shingles vaccines.  -Referrals to GI for colonoscopy and Neurology for sleep apnea evaluation.  -Schedule follow up in 6 months.

## 2021-10-30 NOTE — Progress Notes (Signed)
Established Patient Office Visit     This visit occurred during the SARS-CoV-2 public health emergency.  Safety protocols were in place, including screening questions prior to the visit, additional usage of staff PPE, and extensive cleaning of exam room while observing appropriate contact time as indicated for disinfecting solutions.    CC/Reason for Visit: Annual preventive exam  HPI: Pamela Medina is a 55 y.o. female who is coming in today for the above mentioned reasons. Past Medical History is significant for: Morbid obesity and vitamin D deficiency.  She was started on levothyroxine for hypothyroidism and is followed by endocrinology.  She is concerned about the possibility of sleep apnea as she snores, has apneic episodes at nighttime and has excessive daytime fatigue she tells me she had been diagnosed with sleep apnea years ago in [place].  She has routine eye and dental care.  She exercises by doing some walking on a daily basis.  She is due for flu, COVID booster and shingles vaccines.  She had a colonoscopy in 2019 but is overdue for follow-up due to multiple polyps removed.  She had a mammogram in October and a Pap smear with her GYN.   Past Medical/Surgical History: Past Medical History:  Diagnosis Date   Family history of breast cancer 08/30/2020   Family history of ovarian cancer 08/30/2020   Family history of uterine cancer 08/30/2020   Knee pain, bilateral    Morbid obesity ([facility])     Past Surgical History:  Procedure Laterality Date   ADRENALECTOMY     left   BREAST BIOPSY Bilateral    9 or 10 years ago/ benign   THYROIDECTOMY, PARTIAL      Social History:  reports that she has never smoked. She has never used smokeless tobacco. She reports current alcohol use of about 1.0 standard drink per week. She reports that she does not use drugs.  Allergies: Allergies  Allergen Reactions   Ciprofloxacin Hives and Nausea And Vomiting   Other     Tree  Nuts  Fresh fruit   Penicillins Hives and Nausea Only    Family History:  Family History  Problem Relation Age of Onset   Breast cancer Mother 39   Ovarian cancer Mother        dx early 1s   Breast cancer Maternal Grandmother        dx late 78s   Breast cancer Maternal Aunt        dx late 67s   Uterine cancer Maternal Aunt        dx 37s   Cancer Other        MGM's brother, unknown type   Uterine cancer Maternal Aunt        dx 46s   Cancer Other        MGM's sister, unknown type   Thyroid disease Neg Hx      Current Outpatient Medications:    levothyroxine (SYNTHROID) 50 MCG tablet, Take 1 tablet (50 mcg total) by mouth daily., Disp: 90 tablet, Rfl: 3   Vitamin D, Ergocalciferol, (DRISDOL) 1.25 MG (50000 UNIT) CAPS capsule, Take 1 capsule (50,000 Units total) by mouth every 7 (seven) days., Disp: 13 capsule, Rfl: 3   venlafaxine XR (EFFEXOR XR) 37.5 MG 24 hr capsule, Take 1 capsule (37.5 mg total) by mouth daily with breakfast. (Patient not taking: Reported on 10/30/2021), Disp: 90 capsule, Rfl: 1  Review of Systems:  Constitutional: Denies fever, chills, diaphoresis, appetite change  and fatigue.  HEENT: Denies photophobia, eye pain, redness, hearing loss, ear pain, congestion, sore throat, rhinorrhea, sneezing, mouth sores, trouble swallowing, neck pain, neck stiffness and tinnitus.   Respiratory: Denies SOB, DOE, cough, chest tightness,  and wheezing.   Cardiovascular: Denies chest pain, palpitations and leg swelling.  Gastrointestinal: Denies nausea, vomiting, abdominal pain, diarrhea, constipation, blood in stool and abdominal distention.  Genitourinary: Denies dysuria, urgency, frequency, hematuria, flank pain and difficulty urinating.  Endocrine: Denies: hot or cold intolerance, sweats, changes in hair or nails, polyuria, polydipsia. Musculoskeletal: Denies myalgias, back pain, joint swelling, arthralgias and gait problem.  Skin: Denies pallor, rash and wound.   Neurological: Denies dizziness, seizures, syncope, weakness, light-headedness, numbness and headaches.  Hematological: Denies adenopathy. Easy bruising, personal or family bleeding history  Psychiatric/Behavioral: Denies suicidal ideation, mood changes, confusion, nervousness, sleep disturbance and agitation    Physical Exam: Vitals:   10/30/21 0918  BP: 124/84  Pulse: 80  Temp: 97.6 F (36.4 C)  TempSrc: Oral  SpO2: 100%  Weight: (!) 314 lb 6.4 oz (142.6 kg)  Height: 5\' 9"  (1.753 m)    Body mass index is 46.43 kg/m.   Constitutional: NAD, calm, comfortable, obese Eyes: PERRL, lids and conjunctivae normal ENMT: Mucous membranes are moist. Posterior pharynx clear of any exudate or lesions. Normal dentition. Tympanic membrane is pearly white, no erythema or bulging. Neck: normal, supple, no masses, no thyromegaly Respiratory: clear to auscultation bilaterally, no wheezing, no crackles. Normal respiratory effort. No accessory muscle use.  Cardiovascular: Regular rate and rhythm, no murmurs / rubs / gallops. No extremity edema. 2+ pedal pulses. No carotid bruits.  Abdomen: no tenderness, no masses palpated. No hepatosplenomegaly. Bowel sounds positive.  Musculoskeletal: no clubbing / cyanosis. No joint deformity upper and lower extremities. Good ROM, no contractures. Normal muscle tone.  Skin: no rashes, lesions, ulcers. No induration Neurologic: CN 2-12 grossly intact. Sensation intact, DTR normal. Strength 5/5 in all 4.  Psychiatric: Normal judgment and insight. Alert and oriented x 3. Normal mood.    Impression and Plan:  Encounter for preventive health examination -Recommend routine eye and dental care. -Immunizations: She is due for flu, COVID booster, shingles but declines today despite counseling -Healthy lifestyle discussed in detail. -Labs to be updated today. -Colon cancer screening: January/2019, 3-year follow-up, is now overdue refer to GI. -Breast cancer  screening: 08/2021 -Cervical cancer screening: 08/2021 -Lung cancer screening: Not applicable -Prostate cancer screening: Not applicable -DEXA: Not applicable  Screening for colon cancer  - Plan: Ambulatory referral to Gastroenterology  Morbid obesity ([place])  -Discussed healthy lifestyle, including increased physical activity and better food choices to promote weight loss.  Acute pain of both knees -She has known bilateral knee osteoarthritis and is followed by sports medicine with occasional steroid injections.  Postoperative hypothyroidism -On levothyroxine followed by endocrinology.  Vitamin D deficiency -Check vitamin D.  OSA (obstructive sleep apnea)  -Will refer to sleep medicine/neurology for purpose of a sleep study given high suspicion for obstructive sleep apnea.    Patient Instructions  -Nice seeing you today!!  -Lab work today; will notify you once results are available.  -Remember your flu, COVID booster and shingles vaccines.  -Referrals to GI for colonoscopy and Neurology for sleep apnea evaluation.  -Schedule follow up in 6 months.      Lelon Frohlich, MD [facility]

## 2021-10-31 ENCOUNTER — Other Ambulatory Visit: Payer: Self-pay | Admitting: Internal Medicine

## 2021-10-31 DIAGNOSIS — E559 Vitamin D deficiency, unspecified: Secondary | ICD-10-CM

## 2021-10-31 MED ORDER — VITAMIN D (ERGOCALCIFEROL) 1.25 MG (50000 UNIT) PO CAPS
50000.0000 [IU] | ORAL_CAPSULE | ORAL | 3 refills | Status: DC
Start: 2021-10-31 — End: 2023-08-12

## 2021-11-20 ENCOUNTER — Encounter: Payer: Self-pay | Admitting: Internal Medicine

## 2021-11-20 NOTE — Telephone Encounter (Signed)
FYI- advised to call and schedule appt to discuss her nipple pain.

## 2021-11-21 ENCOUNTER — Ambulatory Visit: Payer: Commercial Managed Care - PPO | Admitting: Internal Medicine

## 2021-11-21 VITALS — BP 134/72 | HR 62 | Temp 98.2°F | Wt 316.2 lb

## 2021-11-21 DIAGNOSIS — N644 Mastodynia: Secondary | ICD-10-CM | POA: Diagnosis not present

## 2021-11-21 NOTE — Progress Notes (Signed)
Acute Office Visit     This visit occurred during the SARS-CoV-2 public health emergency.  Safety protocols were in place, including screening questions prior to the visit, additional usage of staff PPE, and extensive cleaning of exam room while observing appropriate contact time as indicated for disinfecting solutions.    CC/Reason for Visit: Bilateral breast pain  HPI: Pamela Medina is a 56 y.o. female who is coming in today for the above mentioned reasons.  She has been having pain in both breasts for about 10 days.  Her nipples are very sensitive.  She had a hysterectomy approximately 14 years ago.  She does not know if she is menopausal.  She has not experienced any nipple discharges or noticed any lumps.  She had a negative screening mammogram in October 2022.  Past Medical/Surgical History: Past Medical History:  Diagnosis Date   Family history of breast cancer 08/30/2020   Family history of ovarian cancer 08/30/2020   Family history of uterine cancer 08/30/2020   Knee pain, bilateral    Morbid obesity ([place])     Past Surgical History:  Procedure Laterality Date   ADRENALECTOMY     left   BREAST BIOPSY Bilateral    9 or 10 years ago/ benign   THYROIDECTOMY, PARTIAL      Social History:  reports that she has never smoked. She has never used smokeless tobacco. She reports current alcohol use of about 1.0 standard drink per week. She reports that she does not use drugs.  Allergies: Allergies  Allergen Reactions   Ciprofloxacin Hives and Nausea And Vomiting   Other     Tree Nuts  Fresh fruit   Penicillins Hives and Nausea Only    Family History:  Family History  Problem Relation Age of Onset   Breast cancer Mother 83   Ovarian cancer Mother        dx early 66s   Breast cancer Maternal Grandmother        dx late 59s   Breast cancer Maternal Aunt        dx late 57s   Uterine cancer Maternal Aunt        dx 66s   Cancer Other        MGM's brother,  unknown type   Uterine cancer Maternal Aunt        dx 68s   Cancer Other        MGM's sister, unknown type   Thyroid disease Neg Hx      Current Outpatient Medications:    levothyroxine (SYNTHROID) 50 MCG tablet, Take 1 tablet (50 mcg total) by mouth daily., Disp: 90 tablet, Rfl: 3   Vitamin D, Ergocalciferol, (DRISDOL) 1.25 MG (50000 UNIT) CAPS capsule, Take 1 capsule (50,000 Units total) by mouth every 7 (seven) days., Disp: 13 capsule, Rfl: 3   venlafaxine XR (EFFEXOR XR) 37.5 MG 24 hr capsule, Take 1 capsule (37.5 mg total) by mouth daily with breakfast. (Patient not taking: Reported on 10/30/2021), Disp: 90 capsule, Rfl: 1  Review of Systems:  Constitutional: Denies fever, chills, diaphoresis, appetite change and fatigue.  HEENT: Denies photophobia, eye pain, redness, hearing loss, ear pain, congestion, sore throat, rhinorrhea, sneezing, mouth sores, trouble swallowing, neck pain, neck stiffness and tinnitus.   Respiratory: Denies SOB, DOE, cough, chest tightness,  and wheezing.   Cardiovascular: Denies chest pain, palpitations and leg swelling.  Gastrointestinal: Denies nausea, vomiting, abdominal pain, diarrhea, constipation, blood in stool and abdominal distention.  Genitourinary: Denies dysuria, urgency, frequency, hematuria, flank pain and difficulty urinating.  Endocrine: Denies: hot or cold intolerance, sweats, changes in hair or nails, polyuria, polydipsia. Musculoskeletal: Denies myalgias, back pain, joint swelling, arthralgias and gait problem.  Skin: Denies pallor, rash and wound.  Neurological: Denies dizziness, seizures, syncope, weakness, light-headedness, numbness and headaches.  Hematological: Denies adenopathy. Easy bruising, personal or family bleeding history  Psychiatric/Behavioral: Denies suicidal ideation, mood changes, confusion, nervousness, sleep disturbance and agitation    Physical Exam: Vitals:   11/21/21 0658  BP: 134/72  Pulse: 62  Temp: 98.2 F  (36.8 C)  TempSrc: Oral  SpO2: 98%  Weight: (!) 316 lb 3.2 oz (143.4 kg)    Body mass index is 46.69 kg/m.   Constitutional: NAD, calm, comfortable Eyes: PERRL, lids and conjunctivae normal ENMT: Mucous membranes are moist.  Breast exam: She has large pendulous breasts, no lumps noted, no skin color change, nipples and areolas are sensitive to touch, there is no nipple discharge. Neurologic: Grossly intact and nonfocal Psychiatric: Normal judgment and insight. Alert and oriented x 3. Normal mood.    Impression and Plan:  Breast pain -Since bilateral and no red flag signs, especially in face of a negative mammogram 3 months ago, believe this is likely just hormonal changes -Have advised observation over the next couple weeks, she will contact us if symptoms persist.  Time spent: 20 minutes reviewing chart, interviewing and examining patient and formulating plan of care.    Lelon Frohlich, MD [facility]

## 2021-11-25 ENCOUNTER — Ambulatory Visit: Payer: Commercial Managed Care - PPO | Admitting: Internal Medicine

## 2021-12-06 ENCOUNTER — Encounter: Payer: Self-pay | Admitting: Internal Medicine

## 2021-12-22 IMAGING — DX DG KNEE COMPLETE 4+V*R*
4 series · 4 of 4 positions shown · non-contrast
Comparison: None.

CLINICAL DATA: Right knee pain and swelling for 1-2 weeks. No known
injury.

EXAM:
RIGHT KNEE - COMPLETE 4+ VIEW

[knee ap]
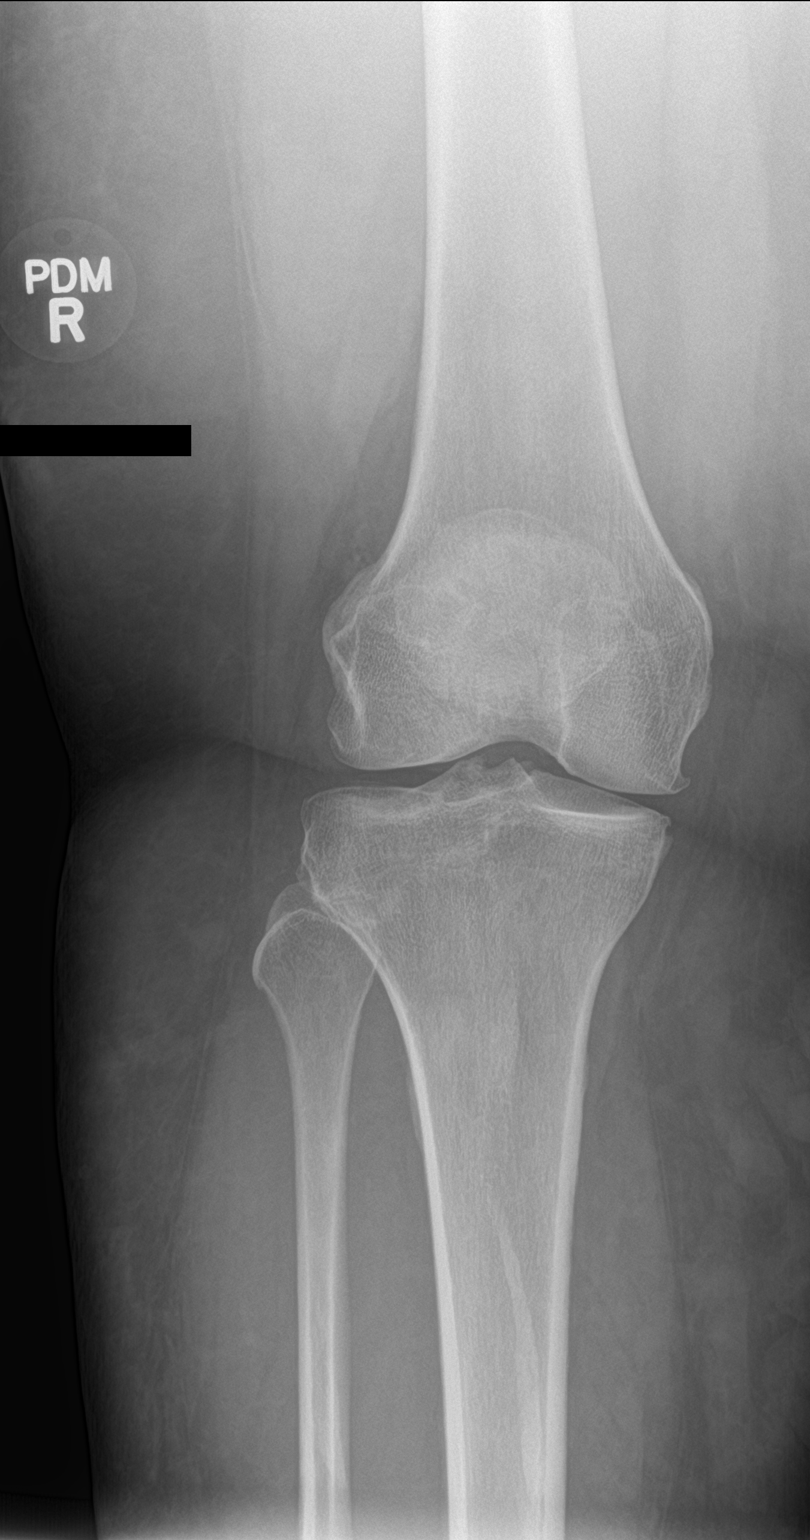

[knee tunnel]
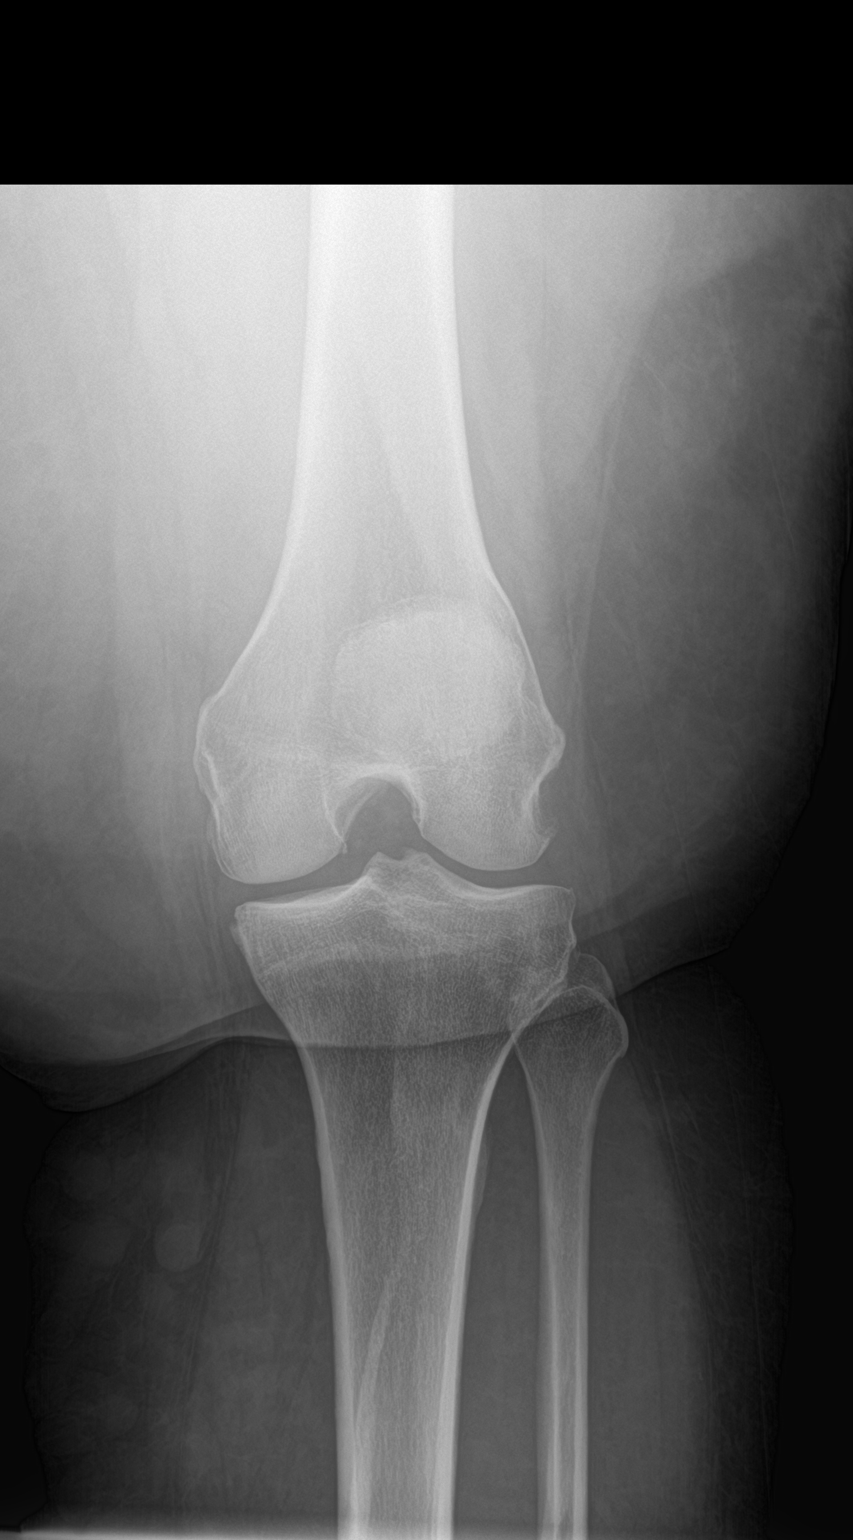

[knee lat]
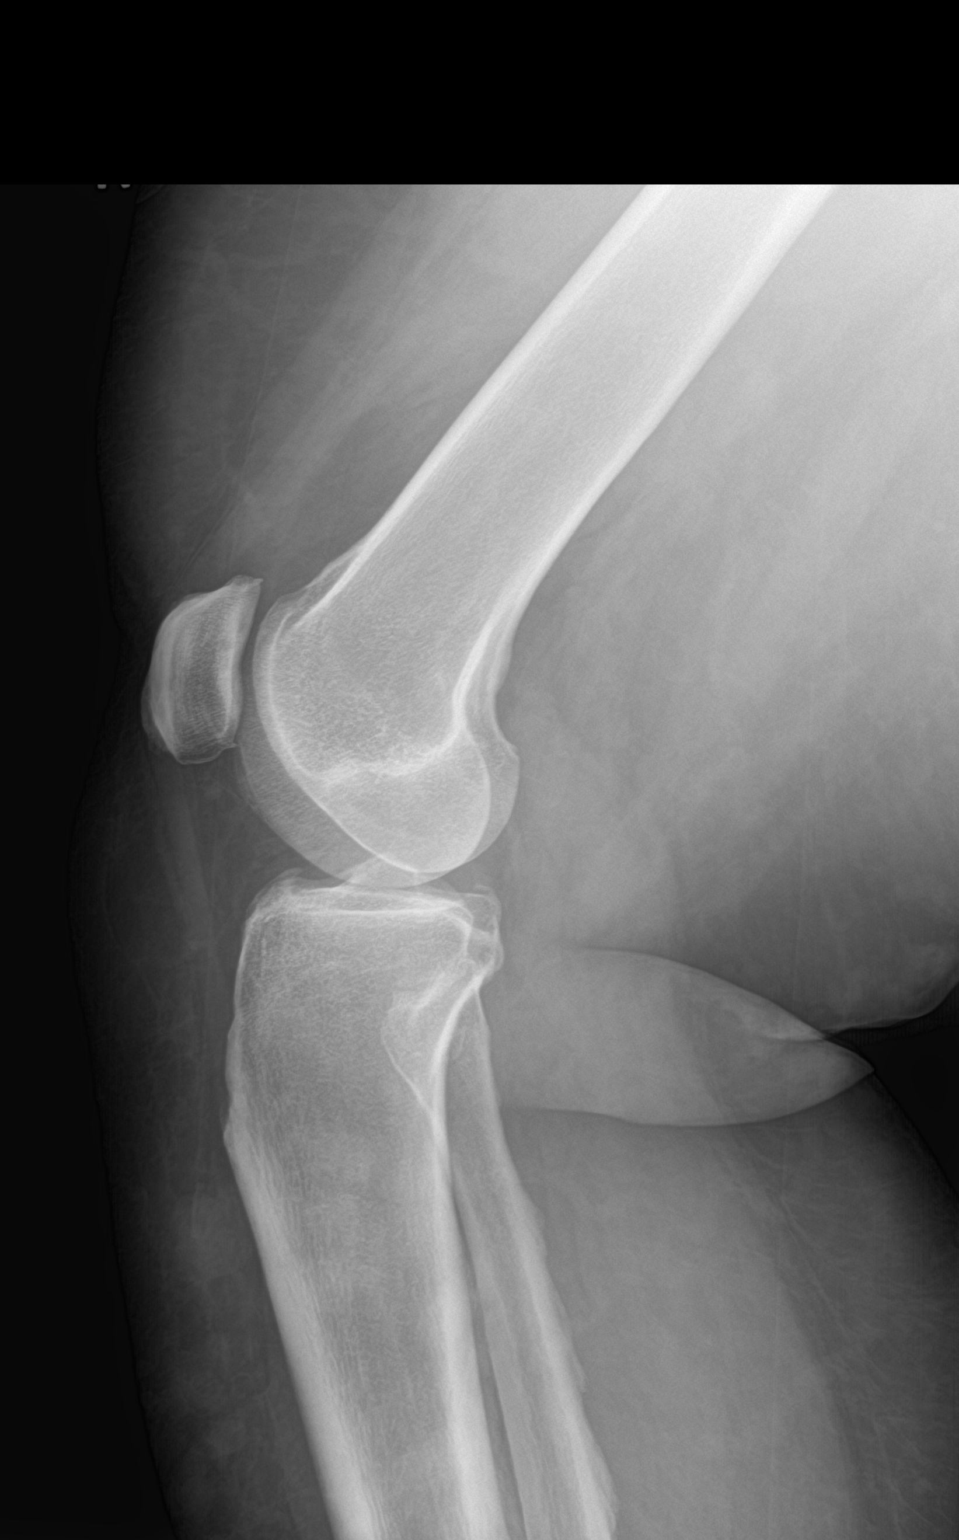

[sunrise]
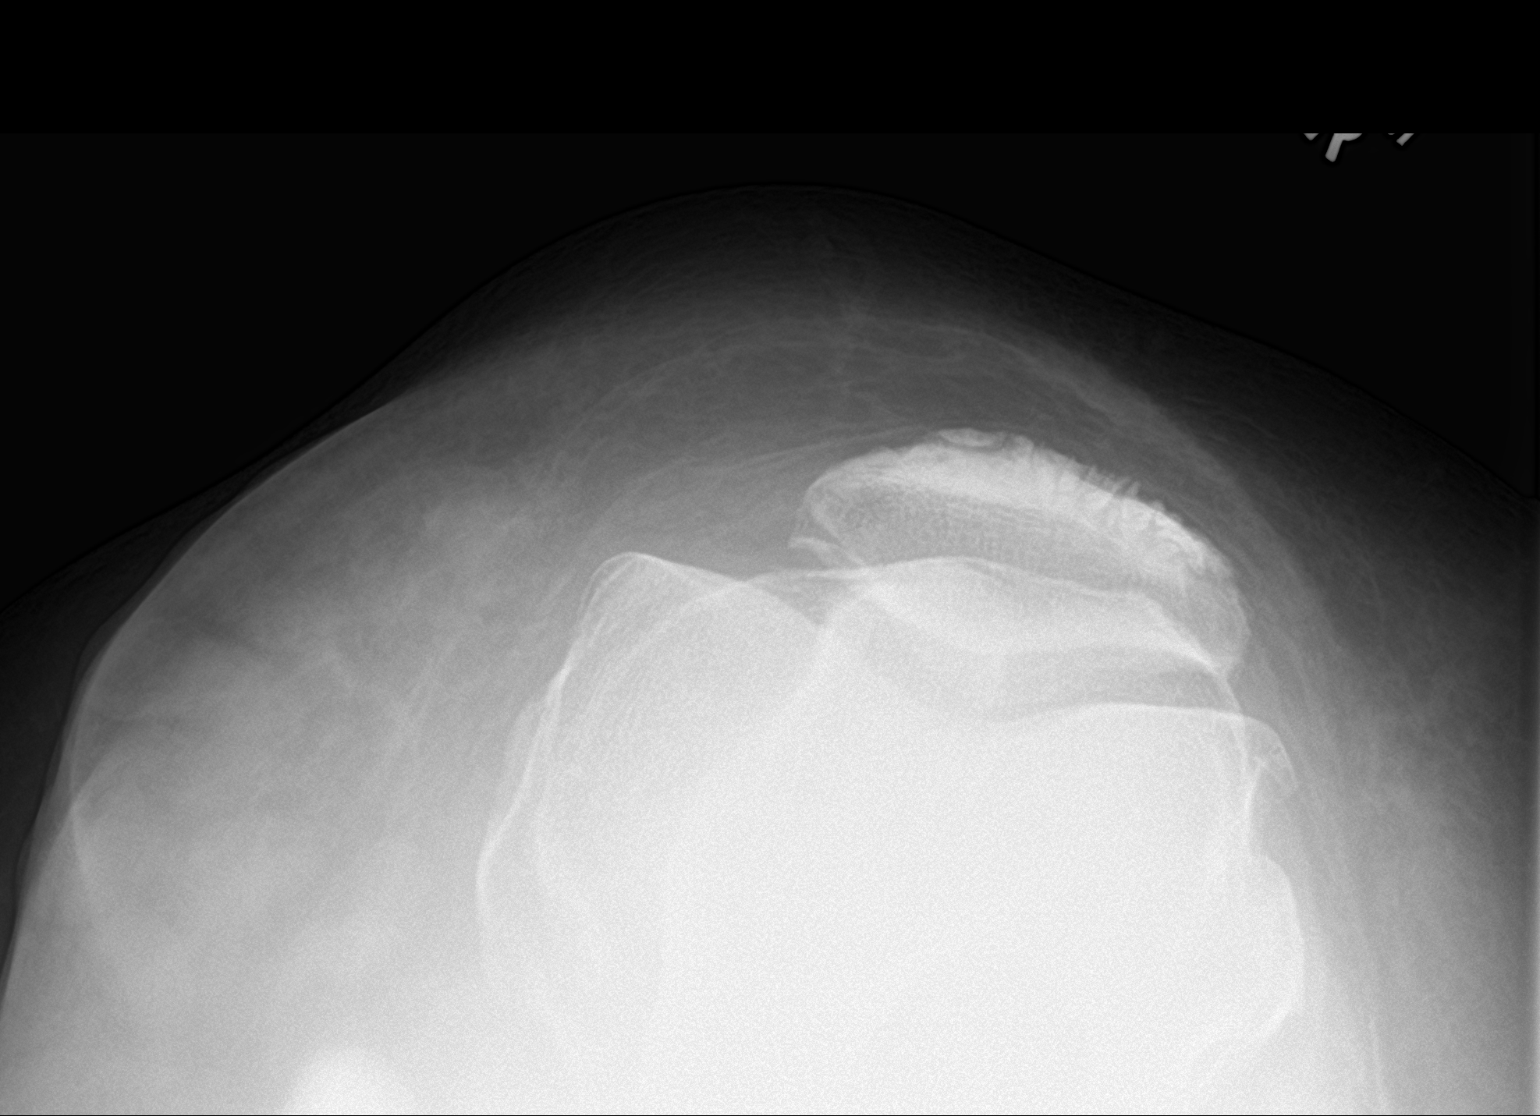

[4 of 4 positions shown; findings below may reference images not displayed]

FINDINGS: The sunrise view is mildly limited. The mineralization and alignment
are normal. There is no evidence of acute fracture or dislocation.
There are mild tricompartmental degenerative changes. Probable
medial varicosities. No joint effusion or other focal soft tissue
abnormalities identified.
IMPRESSION: Mild tricompartmental degenerative changes. No acute osseous
findings.

## 2021-12-22 IMAGING — DX DG KNEE COMPLETE 4+V*L*
4 series · 4 of 4 positions shown · non-contrast
Comparison: None.

CLINICAL DATA: Left knee pain for 1 week.

EXAM:
LEFT KNEE - COMPLETE 4+ VIEW

[knee ap]
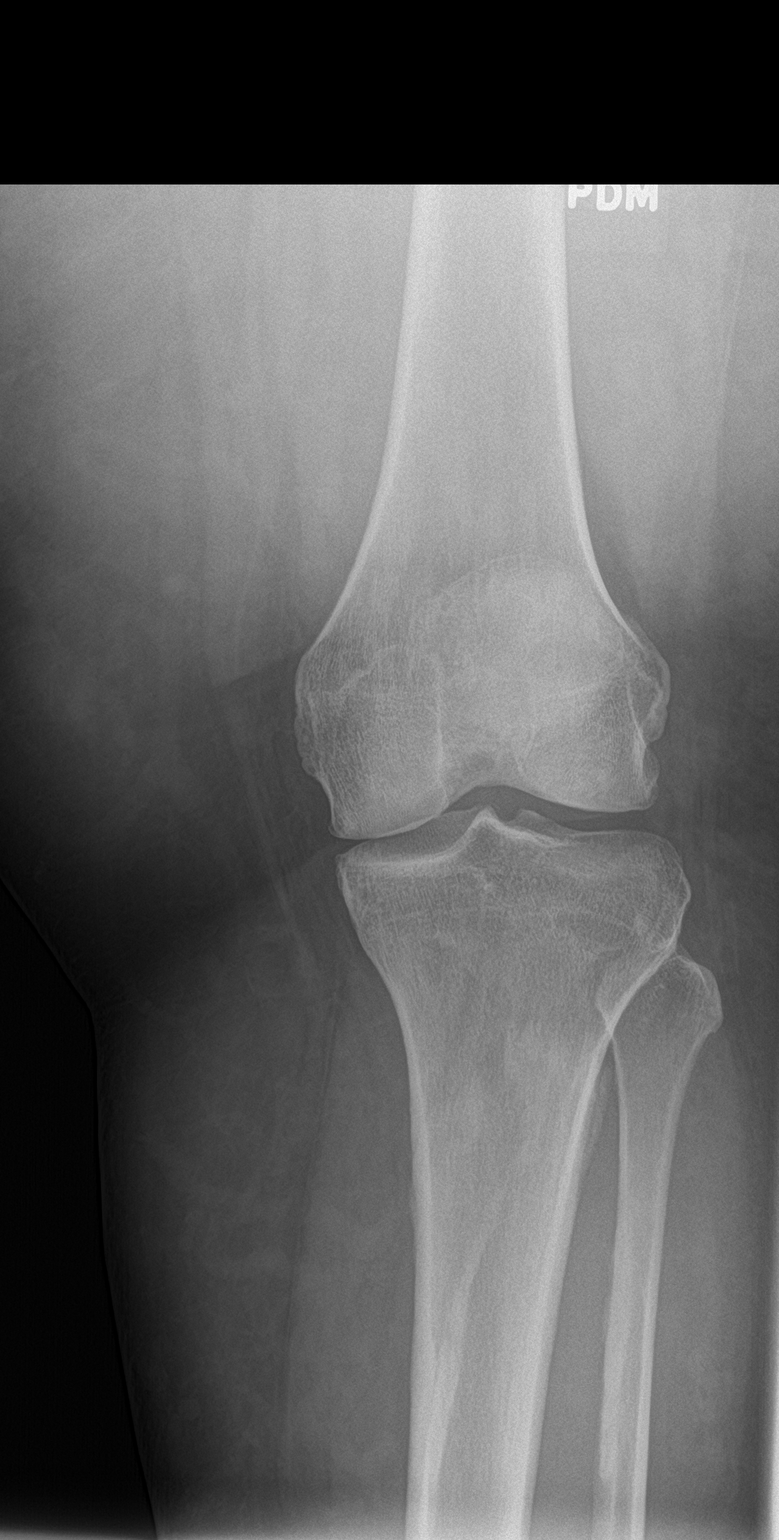

[knee tunnel]
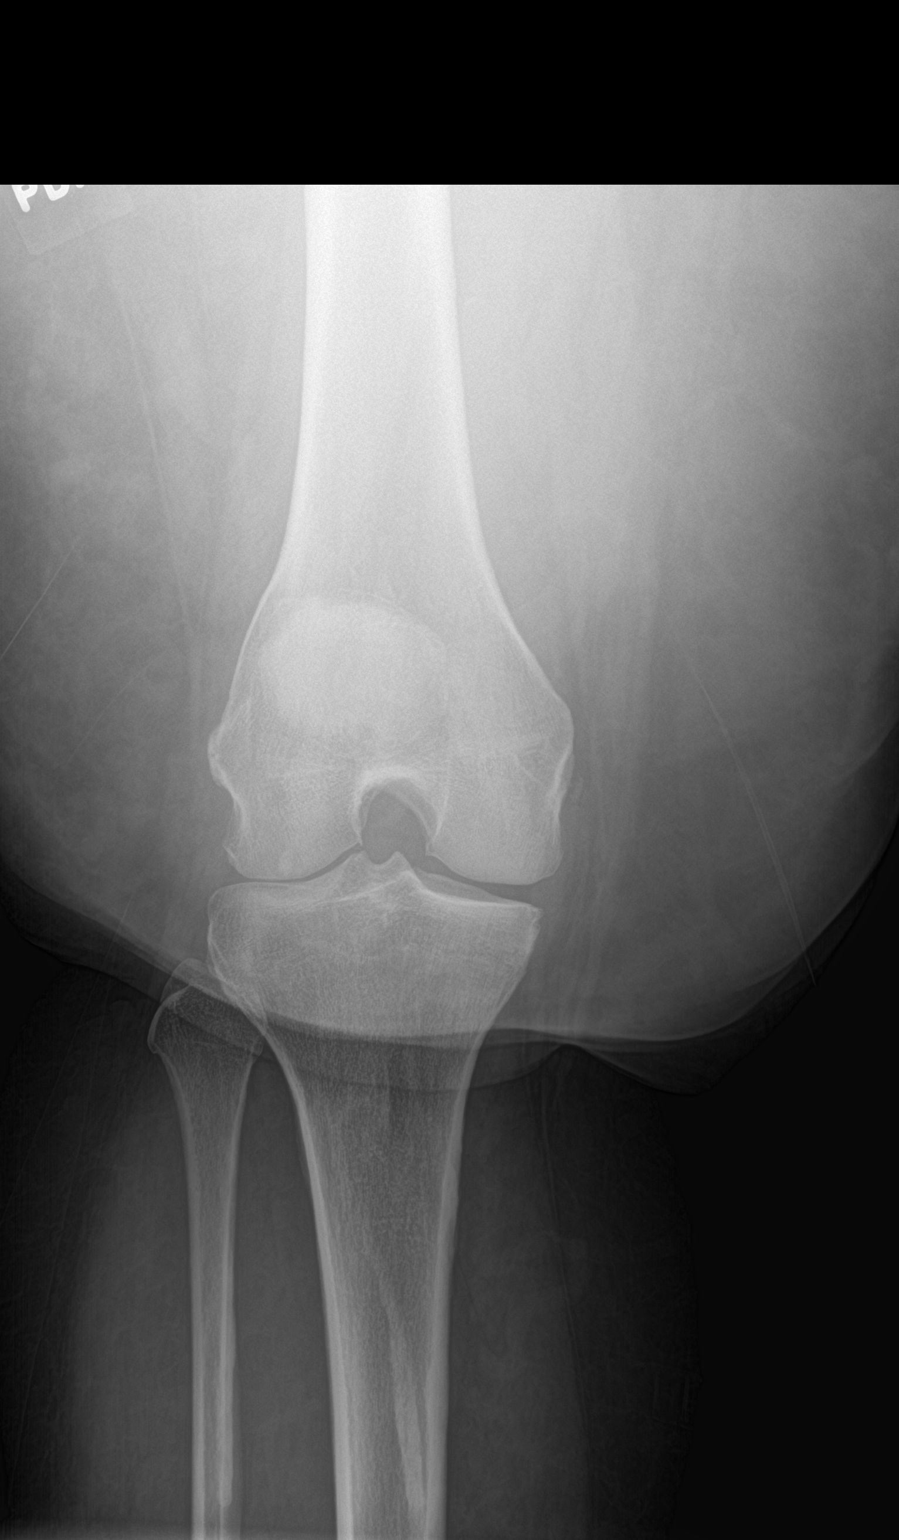

[knee lat]
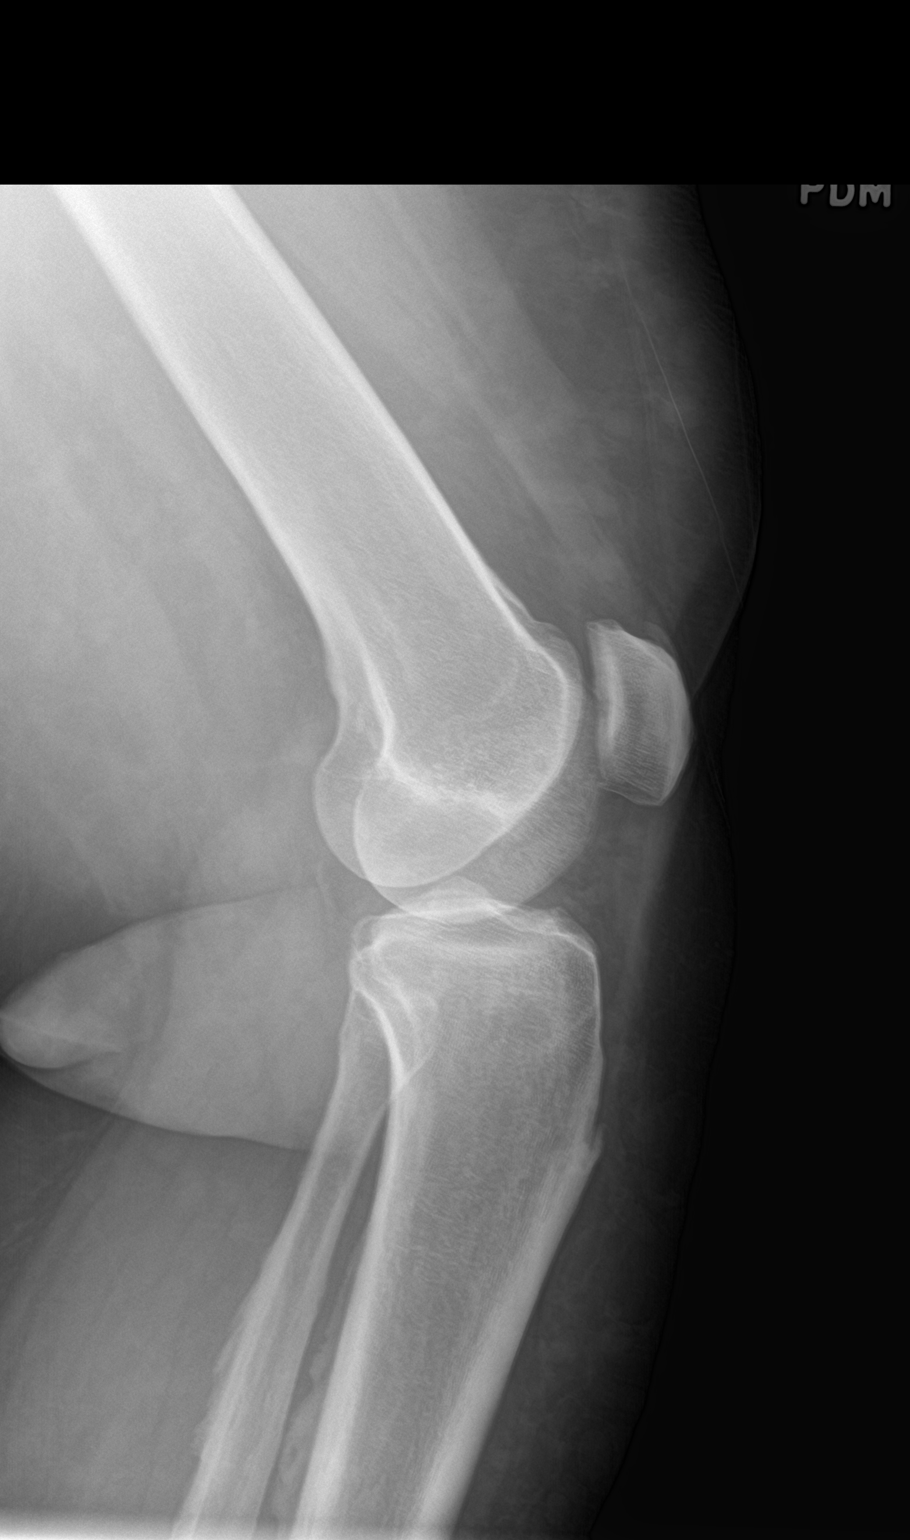

[sunrise]
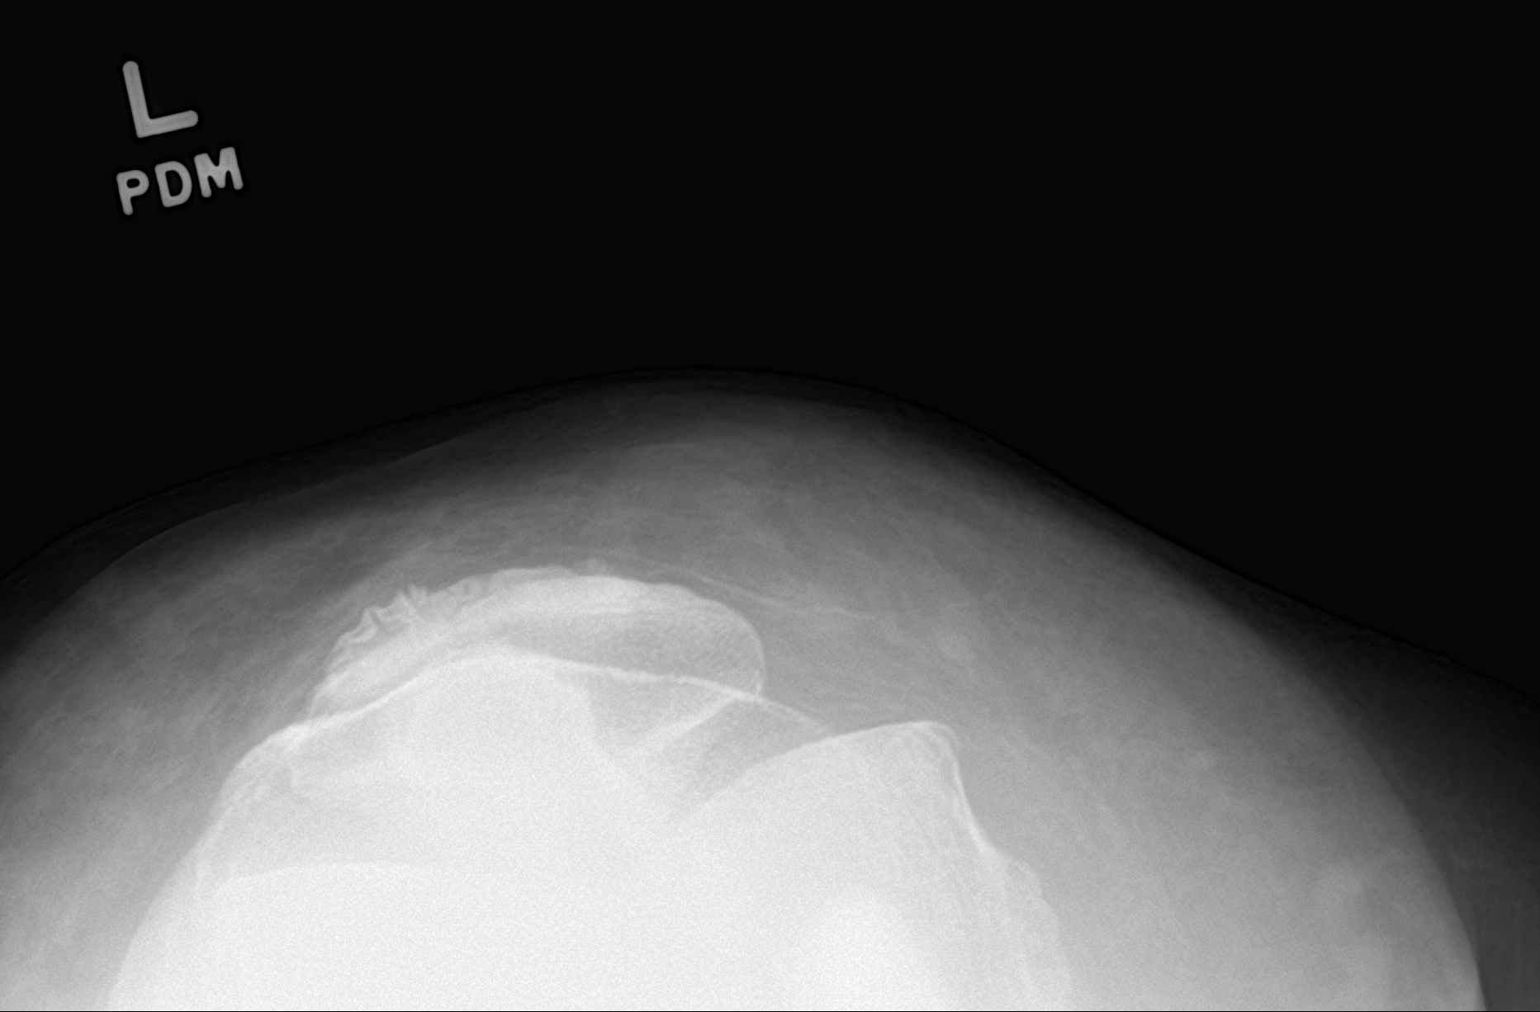

[4 of 4 positions shown; findings below may reference images not displayed]

FINDINGS: The sunrise view is limited. The mineralization and alignment are
normal. There is no evidence of acute fracture or dislocation. There
are mild tricompartmental degenerative changes. No joint effusion or
foreign body.
IMPRESSION: No acute osseous findings. Mild tricompartmental degenerative
changes.

## 2022-01-09 ENCOUNTER — Other Ambulatory Visit: Payer: Self-pay

## 2022-01-09 ENCOUNTER — Ambulatory Visit: Payer: Commercial Managed Care - PPO | Admitting: Nurse Practitioner

## 2022-01-09 ENCOUNTER — Ambulatory Visit: Payer: Commercial Managed Care - PPO | Admitting: Radiology

## 2022-01-09 ENCOUNTER — Encounter: Payer: Self-pay | Admitting: Radiology

## 2022-01-09 VITALS — BP 136/98 | Ht 69.0 in

## 2022-01-09 DIAGNOSIS — R8290 Unspecified abnormal findings in urine: Secondary | ICD-10-CM | POA: Diagnosis not present

## 2022-01-09 LAB — URINALYSIS, COMPLETE W/RFL CULTURE
Bacteria, UA: NONE SEEN /HPF
Bilirubin Urine: NEGATIVE
Glucose, UA: NEGATIVE
Hgb urine dipstick: NEGATIVE
Hyaline Cast: NONE SEEN /LPF
Ketones, ur: NEGATIVE
Leukocyte Esterase: NEGATIVE
Nitrites, Initial: NEGATIVE
Protein, ur: NEGATIVE
RBC / HPF: NONE SEEN /HPF (ref 0–2)
Specific Gravity, Urine: 1.015 (ref 1.001–1.035)
WBC, UA: NONE SEEN /HPF (ref 0–5)
pH: 6 (ref 5.0–8.0)

## 2022-01-09 LAB — NO CULTURE INDICATED

## 2022-01-09 NOTE — Progress Notes (Signed)
? ? ? ? ?  SUBJECTIVE: Pamela Medina is a 56 y.o. female who complains of cloudy urine, odor in urine, frequency, no dysuria, no vaginal complaints----currently on day 3 of macrobid  (x's 2 months after being on antibiotics for 2 months for eye infections--took diflucan (01/26123) took macrobid 12/03/21 and sulfa 12/22/21 and macrobid 01/04/22----tx'd by telemed ? ?OBJECTIVE: Appears well, in no apparent distress.  Vital signs are normal. The abdomen is soft without tenderness, guarding, mass, rebound or organomegaly. No CVA tenderness or inguinal adenopathy noted. Urine dipstick shows negative for all components.  Micro exam: negative for WBC's or RBC's.  ? ?Chaperone offered and declined for exam. ? ?ASSESSMENT: UTI; uncomplicated without evidence of pyelonephritis ? ?PLAN:  ? ?1. Cloudy urine ? ?- Urinalysis,Complete w/RFL Culture  ?Increase fluid intake. Call or return to clinic prn if these symptoms worsen or fail to improve as anticipated.   ? ? ?

## 2022-01-15 ENCOUNTER — Institutional Professional Consult (permissible substitution): Payer: Commercial Managed Care - PPO | Admitting: Neurology

## 2022-02-03 NOTE — Telephone Encounter (Signed)
LM on vmail to call back to schedule 

## 2022-02-05 ENCOUNTER — Ambulatory Visit: Payer: Commercial Managed Care - PPO | Admitting: Internal Medicine

## 2022-02-05 ENCOUNTER — Encounter: Payer: Self-pay | Admitting: Internal Medicine

## 2022-02-05 VITALS — BP 130/76 | HR 70 | Ht 69.0 in | Wt 315.0 lb

## 2022-02-05 DIAGNOSIS — E890 Postprocedural hypothyroidism: Secondary | ICD-10-CM | POA: Diagnosis not present

## 2022-02-05 DIAGNOSIS — E559 Vitamin D deficiency, unspecified: Secondary | ICD-10-CM | POA: Diagnosis not present

## 2022-02-05 DIAGNOSIS — R232 Flushing: Secondary | ICD-10-CM

## 2022-02-05 LAB — T4, FREE: Free T4: 0.98 ng/dL (ref 0.60–1.60)

## 2022-02-05 LAB — ESTRADIOL: Estradiol: 125 pg/mL

## 2022-02-05 LAB — TSH: TSH: 1.68 u[IU]/mL (ref 0.35–5.50)

## 2022-02-05 LAB — FOLLICLE STIMULATING HORMONE: FSH: 4.1 m[IU]/mL

## 2022-02-05 LAB — VITAMIN D 25 HYDROXY (VIT D DEFICIENCY, FRACTURES): VITD: 21.98 ng/mL — ABNORMAL LOW (ref 30.00–100.00)

## 2022-02-05 NOTE — Patient Instructions (Signed)

## 2022-02-05 NOTE — Progress Notes (Addendum)
? ?Name: Pamela Medina  ?MRN/ DOB: [id number], 09-01-1966    ?Age/ Sex: 56 y.o., female   ? ? ?PCP: Isaac Bliss, Rayford Halsted, MD   ?Reason for Endocrinology Evaluation: Hypothyroidism  ?   ?Initial Endocrinology Clinic Visit: 03/27/2021  ? ? ?PATIENT IDENTIFIER: Pamela Medina is a 56 y.o., female with a past medical history of MNG. She has followed with [facility] since 03/27/2021 for consultative assistance with management of her  MND  ? ?HISTORICAL SUMMARY:  ? ?Patient has an incidental finding of a left thyroid nodule on CT scan, per patient she had an FNA with benign cytology in 2017 in [place] . She is S/P left lobectomy due to 6 cm nodule, benign pathology  ? ?She has been noted with a slight elevation of the TSH  For the past 2-3 yrs, in 08/2020  Had a TSH of 5.55 you IU/mL in 08/2020, this has been confirmed in 01/2021.  She was offered LT-4 replacement by her previous endocrinologist. ? ?Patient was started on LT-4 replacement in 04/2021 ? ? ? ? ?ADRENAL HISTORY: ? 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  Unknown hormonal status prior to surgery. ? ?Aldo, renin, catecholamines and 24-hour urinary cortisol have all come back normal 06/2021 ? ?She switched care from Dr. Loanne Drilling to me by 04/2021 ? ? ?No Fh of thyroid and adrenal disease  ? ? ?SUBJECTIVE:  ? ? ?Today (02/05/2022):  Pamela Medina is here for subclinical hypothyroidism .  ? ?Weight continues to fluctuate  ?Has chronic constipation , she is pending colonoscopy  ?Denies local neck symptoms  ?She continues with fatigue  and heat intolerance . Saw Gyn but no HRT offered due to high risk given FH of breast and ovarian cancer  ?She has been having issues with left eye infection  ? ? ?Levothyroxine 50 mcg daily  ?Vitamin D 50,000 iu weekly  ? ? ? ?HISTORY:  ?Past Medical History:  ?Past Medical History:  ?Diagnosis Date  ? Family history of breast cancer 08/30/2020  ? Family history of ovarian cancer 08/30/2020  ?  Family history of uterine cancer 08/30/2020  ? Knee pain, bilateral   ? Morbid obesity ([place])   ? ?Past Surgical History:  ?Past Surgical History:  ?Procedure Laterality Date  ? ADRENALECTOMY    ? left  ? BREAST BIOPSY Bilateral   ? 9 or 10 years ago/ benign  ? hystrectomy    ? THYROIDECTOMY, PARTIAL    ? ?Social History:  reports that she has never smoked. She has never used smokeless tobacco. She reports current alcohol use of about 1.0 standard drink per week. She reports that she does not use drugs. ?Family History:  ?Family History  ?Problem Relation Age of Onset  ? Breast cancer Mother 60  ? Ovarian cancer Mother   ?     dx early 20s  ? Breast cancer Maternal Grandmother   ?     dx late 36s  ? Breast cancer Maternal Aunt   ?     dx late 25s  ? Uterine cancer Maternal Aunt   ?     dx 64s  ? Cancer Other   ?     MGM's brother, unknown type  ? Uterine cancer Maternal Aunt   ?     dx 85s  ? Cancer Other   ?     MGM's sister, unknown type  ? Thyroid disease Neg Hx   ? ? ? ?  HOME MEDICATIONS: ?Allergies as of 02/05/2022   ? ?   Reactions  ? Ciprofloxacin Hives, Nausea And Vomiting  ? Other   ? Tree Nuts ?Fresh fruit  ? Penicillins Hives, Nausea Only  ? ?  ? ?  ?Medication List  ?  ? ?  ? Accurate as of February 05, 2022  2:52 PM. If you have any questions, ask your nurse or doctor.  ?  ?  ? ?  ? ?STOP taking these medications   ? ?nitrofurantoin (macrocrystal-monohydrate) 100 MG capsule ?Commonly known as: MACROBID ?Stopped by: Dorita Sciara, MD ?  ? ?  ? ?TAKE these medications   ? ?levothyroxine 50 MCG tablet ?Commonly known as: SYNTHROID ?Take 1 tablet (50 mcg total) by mouth daily. ?  ?Vitamin D (Ergocalciferol) 1.25 MG (50000 UNIT) Caps capsule ?Commonly known as: DRISDOL ?Take 1 capsule (50,000 Units total) by mouth every 7 (seven) days. ?  ? ?  ? ? ? ? ?OBJECTIVE:  ? ?PHYSICAL EXAM: ?VS: BP 130/76 (BP Location: Left Arm, Patient Position: Sitting, Cuff Size: Large)   Pulse 70   Ht 5\' 9"  (1.753 m)   Wt (!)  315 lb (142.9 kg)   SpO2 96%   BMI 46.52 kg/m?   ? ?EXAM: ?General: Pt appears well and is in NAD  ?Neck: General: Supple without adenopathy. ?Thyroid:  No goiter or nodules appreciated.   ?Lungs: Clear with good BS bilat with no rales, rhonchi, or wheezes  ?Heart: Auscultation: RRR.  ?Abdomen: Normoactive bowel sounds, soft, nontender, without masses or organomegaly palpable  ?Extremities:  ?BL LE: No pretibial edema normal ROM and strength.  ?Mental Status: Judgment, insight: Intact ?Orientation: Oriented to time, place, and person ?Mood and affect: No depression, anxiety, or agitation  ? ? ? ?DATA REVIEWED: ? ?Results for Pamela, Medina (MRN [id number]) as of 08/06/2021 10:27 ? Ref. Range 08/05/2021 16:05  ?TSH Latest Ref Range: 0.35 - 5.50 uIU/mL 1.94  ? ?Thyroid Ultrasound 04/24/2021 ? ? ?Nodule # 1: ?  ?Location: Right; Mid ?  ?Maximum size: 0.6 cm; Other 2 dimensions: 0.6 x 0.5 cm ?  ?Composition: solid/almost completely solid (2) ?  ?Echogenicity: hypoechoic (2) ?  ?Shape: not taller-than-wide (0) ?  ?Margins: ill-defined (0) ?  ?Echogenic foci: none (0) ?  ?ACR TI-RADS total points: 4. ?  ?ACR TI-RADS risk category: TR4 (4-6 points). ?  ?ACR TI-RADS recommendations: ?  ?Given size (<0.9 cm) and appearance, this nodule does NOT meet ?TI-RADS criteria for biopsy or dedicated follow-up. ?  ?_________________________________________________________ ?  ?Nodule # 2: ?  ?Location: Right; Inferior ?  ?Maximum size: 1.1 cm; Other 2 dimensions: 0.6 x 0.6 cm ?  ?Composition: cystic/almost completely cystic (0) ?  ?Echogenicity: anechoic (0) ?  ?Shape: not taller-than-wide (0) ?  ?Margins: smooth (0) ?  ?Echogenic foci: none (0) ?  ?ACR TI-RADS total points: 0. ?  ?ACR TI-RADS risk category: TR1 (0-1 points). ?  ?ACR TI-RADS recommendations: ?  ?This nodule does NOT meet TI-RADS criteria for biopsy or dedicated ?follow-up. ?  ?_________________________________________________________ ?  ?No cervical lymphadenopathy. ?   ?IMPRESSION: ?1. Postsurgical changes after left hemithyroidectomy without ?evidence of local recurrence or cervical lymphadenopathy. ?2. Benign-appearing nodules in the mid inferior right thyroid which ?do not warrant additional ultrasound follow-up or tissue sampling. ? ?ASSESSMENT / PLAN / RECOMMENDATIONS:  ? ?Subclinical Hypothyroidism , S/P left lobectomy : ? ? ?- Pt continues with fatigue but her TSH is optimal . ? ?- No changes at  this time  ? ?Medications  ? ?Continue Levothyroxine 50 mcg daily  ? ? ?2. Hx of left Lobectomy: ? ? ? ?- Due to benign reasons  ?-Ultrasound showed right thyroid nodules, non meets criteria for FNA or surveillance  ? ? ? ?3. Hx of vitamin D deficiency :  ? ? ?-Vitamin D continues to be low, will continue ergocalciferol and OTC 2000 IU daily ? ? ?Medication ?Ergocalciferol 50,000 iu weekly  ?Vitamin D3 2000 IU daily ? ?4. Hot flashes : ? ? ?- Saw Gyn last year, they prescribed SSRI's but she opted not to take as her symptoms are tolerable  ?- She is a high risk for HRT due to [place] of breast and ovarian cancer  ?-We discussed other options to include clonidine and gabapentin, her symptoms at this time are not interfering with any activities and will continue to monitor ?-[place] estradiol normal with no evidence of postmenopause ? ?F/U in 1 yr  ? ? ? ?Signed electronically by: ?Abby Nena Jordan, MD ? ?[facility]  ?[facility] ?[address] ?[place],  [address] ?Phone: [phone] ?[id number]  ? ? ? ? ?CC: ?Isaac Bliss, Rayford Halsted, MD ?[place] ?[place] [address] ?Phone: [phone]  ?Fax: [phone] ? ? ?Return to Endocrinology clinic as below: ?Future Appointments  ?Date Time Provider [place]  ?02/05/2022  3:00 PM Na Waldrip, Melanie Crazier, MD [facility] None  ?02/24/2022 10:15 AM Star Age, MD [facility] None  ?  ? ?

## 2022-02-19 ENCOUNTER — Telehealth: Payer: Self-pay | Admitting: Gastroenterology

## 2022-02-19 NOTE — Telephone Encounter (Signed)
Hi Dr. Bryan Lemma, ? ?D.O.D. ? ?We received a referral from PCP for colonoscopy. Last colonoscopy: 08/10/2018 at [facility] in [place]. Records are available on Epic for review. Please advise on scheduling. ? ?Thank you  ?

## 2022-02-24 ENCOUNTER — Ambulatory Visit: Payer: Commercial Managed Care - PPO | Admitting: Neurology

## 2022-02-24 ENCOUNTER — Encounter: Payer: Self-pay | Admitting: Neurology

## 2022-02-24 VITALS — BP 130/82 | HR 58 | Ht 69.0 in | Wt 316.4 lb

## 2022-02-24 DIAGNOSIS — R0681 Apnea, not elsewhere classified: Secondary | ICD-10-CM | POA: Diagnosis not present

## 2022-02-24 DIAGNOSIS — Z6841 Body Mass Index (BMI) 40.0 and over, adult: Secondary | ICD-10-CM

## 2022-02-24 DIAGNOSIS — G4733 Obstructive sleep apnea (adult) (pediatric): Secondary | ICD-10-CM

## 2022-02-24 DIAGNOSIS — R635 Abnormal weight gain: Secondary | ICD-10-CM | POA: Diagnosis not present

## 2022-02-24 DIAGNOSIS — R519 Headache, unspecified: Secondary | ICD-10-CM

## 2022-02-24 DIAGNOSIS — R351 Nocturia: Secondary | ICD-10-CM

## 2022-02-24 NOTE — Patient Instructions (Signed)
Thank you for choosing [facility] for your sleep related care! It was nice to meet you today!   Here is what we discussed today:    Based on your symptoms and your exam I believe you still are at risk for obstructive sleep apnea (aka OSA). We should proceed with a sleep study to determine whether you do or do not have OSA and how severe it is. Even, if you have mild OSA, I may want you to consider treatment with CPAP, as treatment of even borderline or mild sleep apnea can result and improvement of symptoms such as sleep disruption, daytime sleepiness, nighttime bathroom breaks, restless leg symptoms, improvement of headache syndromes, even improved mood disorder.   As explained, an attended sleep study (meaning you get to stay overnight in the sleep lab), lets us monitor sleep-related behaviors such as sleep talking and leg movements in sleep, in addition to monitoring for sleep apnea.  A home sleep test is a screening tool for sleep apnea diagnosis only, but unfortunately, does not help with any other sleep-related diagnoses.  Please remember, the long-term risks and ramifications of untreated moderate to severe obstructive sleep apnea may include (but are not limited to): increased risk for cardiovascular disease, including congestive heart failure, stroke, difficult to control hypertension, treatment resistant obesity, arrhythmias, especially irregular heartbeat commonly known as A. Fib. (atrial fibrillation); even type 2 diabetes has been linked to untreated OSA.   Other correlations that untreated obstructive sleep apnea include macular edema which is swelling of the retina in the eyes, droopy eyelid syndrome, and elevated hemoglobin and hematocrit levels (often referred to as polycythemia).  Sleep apnea can cause disruption of sleep and sleep deprivation in most cases, which, in turn, can cause recurrent headaches, problems with memory, mood, concentration, focus, and  vigilance. Most people with untreated sleep apnea report excessive daytime sleepiness, which can affect their ability to drive. Please do not drive or use heavy equipment or machinery, if you feel sleepy! Patients with sleep apnea can also develop difficulty initiating and maintaining sleep (aka insomnia).   Having sleep apnea may increase your risk for other sleep disorders, including involuntary behaviors sleep such as sleep terrors, sleep talking, sleepwalking.    Having sleep apnea can also increase your risk for restless leg syndrome and leg movements at night.   Please note that untreated obstructive sleep apnea may carry additional perioperative morbidity. Patients with significant obstructive sleep apnea (typically, in the moderate to severe degree) should receive, if possible, perioperative PAP (positive airway pressure) therapy and the surgeons and particularly the anesthesiologists should be informed of the diagnosis and the severity of the sleep disordered breathing.   We will call you or email you through MyChart with regards to your test results and plan a follow-up in sleep clinic accordingly. Most likely, you will hear from one of our nurses.   Our sleep lab administrative assistant will call you to schedule your sleep study and give you further instructions, regarding the check in process for the sleep study, arrival time, what to bring, when you can expect to leave after the study, etc., and to answer any other logistical questions you may have. If you don't hear back from her by about 2 weeks from now, please feel free to call her direct line at [phone] or you can call our general clinic number, or email us through My Chart.   

## 2022-02-24 NOTE — Progress Notes (Signed)
Subjective:  ?  ?Patient ID: Pamela Medina is a 56 y.o. female. ? ?HPI ? ? ? ?Huston Foley, MD, PhD ?[facility] ?[address], [address] ?[phone] ?[place], [place] [address] ? ?Dear Dr. Philip Aspen, ? ?I saw your patient, Pamela Medina, upon your kind request in the sleep clinic today for initial consultation of her sleep disorder, in particular, evaluation of her prior diagnosis of obstructive sleep apnea.  The patient is unaccompanied today.  As you know, Pamela Medina is a 56 year old right-handed woman with an underlying medical history of hypothyroidism, vitamin D deficiency, knee pain, colonic polyps, and severe obesity with a BMI of over 45, who reports a prior diagnosis of obstructive sleep apnea.  Study was years ago, study was [place] and prior study results are not available for my review today.  I reviewed your office note from 10/30/2021.  Her Epworth sleepiness score is 7 out of 24, fatigue severity score is 25 out of 63.  She used to live in [place] until about 5 years ago and then moved to [place].  She had sleep testing about 8 or 9 years ago and had a CPAP machine but no longer has a machine.  She moved from [place] about 2 years ago.  She is single and lives with her 62 year old daughter.  They have 1 dog in the household.  She has a TV in her bedroom and it turns off on a sleep timer.  As she recalls, she had moderate obstructive sleep apnea in the past.  She reports weight gain in the realm of 30 pounds over the past several years.  Bedtime is generally between midnight and 1 AM and rise time between 6:30 AM and 70.  She has nocturia about once or twice per average night.  She has had occasional morning headaches.  She is not aware of any family history of sleep apnea.  She is a non-smoker and drinks alcohol occasionally.  She drinks caffeine in the form of coffee, 1 or 2 cups/day and 1 large serving of sweet tea per day.  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 ? ?Her Past Medical History Is Significant For: ?Past Medical History:  ?Diagnosis Date  ? Family history of breast cancer 08/30/2020  ? Family history of ovarian cancer 08/30/2020  ? Family history of uterine cancer 08/30/2020  ? Knee pain, bilateral   ? Morbid obesity ([facility])   ? ? ?Her Past Surgical History Is Significant For: ?Past Surgical History:  ?Procedure Laterality Date  ? ADRENALECTOMY    ? left  ? BREAST BIOPSY Bilateral   ? 9 or 10 years ago/ benign  ? hystrectomy    ? THYROIDECTOMY, PARTIAL    ? ? ?Her Family History Is Significant For: ?Family History  ?Problem Relation Age of Onset  ? Breast cancer Mother 42  ? Ovarian cancer Mother   ?     dx early 64s  ? Breast cancer Maternal Aunt   ?     dx late 31s  ? Uterine cancer Maternal Aunt   ?     dx 60s  ? Uterine cancer Maternal Aunt   ?     dx 94s  ? Breast cancer Maternal Grandmother   ?     dx late 54s  ? Cancer Other   ?     MGM's brother, unknown type  ? Cancer Other   ?  MGM's sister, unknown type  ? Thyroid disease Neg Hx   ? Sleep apnea Neg Hx   ? ? ?Her Social History Is Significant For: ?Social History  ? ?Socioeconomic History  ? Marital status: Single  ?  Spouse name: Not on file  ? Number of children: Not on file  ? Years of education: Not on file  ? Highest education level: Associate degree: academic program  ?Occupational History  ? Not on file  ?Tobacco Use  ? Smoking status: Never  ? Smokeless tobacco: Never  ?Vaping Use  ? Vaping Use: Never used  ?Substance and Sexual Activity  ? Alcohol use: Yes  ?  Alcohol/week: 1.0 standard drink  ?  Types: 1 Glasses of wine per week  ?  Comment: occasional  ? Drug use: Never  ? Sexual activity: Yes  ?  Partners: Male  ?Other Topics Concern  ? Not on file  ?Social History Narrative  ? Not on file  ? ?Social Determinants of Health  ? ?Financial Resource Strain: Low Risk   ? Difficulty of Paying Living Expenses: Not very hard  ?Food Insecurity: No  Food Insecurity  ? Worried About Programme researcher, broadcasting/film/video in the Last Year: Never true  ? Ran Out of Food in the Last Year: Never true  ?Transportation Needs: No Transportation Needs  ? Lack of Transportation (Medical): No  ? Lack of Transportation (Non-Medical): No  ?Physical Activity: Insufficiently Active  ? Days of Exercise per Week: 3 days  ? Minutes of Exercise per Session: 20 min  ?Stress: Stress Concern Present  ? Feeling of Stress : To some extent  ?Social Connections: Moderately Isolated  ? Frequency of Communication with Friends and Family: More than three times a week  ? Frequency of Social Gatherings with Friends and Family: Twice a week  ? Attends Religious Services: 1 to 4 times per year  ? Active Member of Clubs or Organizations: No  ? Attends Banker Meetings: Not on file  ? Marital Status: Never married  ? ? ?Her Allergies Are:  ?Allergies  ?Allergen Reactions  ? Ciprofloxacin Hives and Nausea And Vomiting  ? Other   ?  Tree Nuts ? ?Fresh fruit  ? Penicillins Hives and Nausea Only  ?:  ? ?Her Current Medications Are:  ?Outpatient Encounter Medications as of 02/24/2022  ?Medication Sig  ? levothyroxine (SYNTHROID) 50 MCG tablet Take 1 tablet (50 mcg total) by mouth daily.  ? Vitamin D, Ergocalciferol, (DRISDOL) 1.25 MG (50000 UNIT) CAPS capsule Take 1 capsule (50,000 Units total) by mouth every 7 (seven) days.  ? ?No facility-administered encounter medications on file as of 02/24/2022.  ?: ? ? ?Review of Systems:  ?Out of a complete 14 point review of systems, all are reviewed and negative with the exception of these symptoms as listed below: ? ?Review of Systems  ?Neurological:   ?     Pt is here for sleep consult Pt states she snores ,headaches,fatigue.Pt denies hypertension  Pt states she had sleep study 9 years ago and she no longer has CPAP machine.  ? ? ?ESS:7 ?FSS :25   ? ?Objective:  ?Neurological Exam ? ?Physical Exam ?Physical Examination:  ? ?Vitals:  ? 02/24/22 1002  ?BP: 130/82   ?Pulse: (!) 58  ? ? ?General Examination: The patient is a very pleasant 56 y.o. female in no acute distress. She appears well-developed and well-nourished and well groomed.  ? ?HEENT: Normocephalic, atraumatic, pupils are equal, round and reactive  to light, extraocular tracking is good without limitation to gaze excursion or nystagmus noted. Hearing is grossly intact. Face is symmetric with normal facial animation. Speech is clear with no dysarthria noted. There is no hypophonia. There is no lip, neck/head, jaw or voice tremor. Neck is supple with full range of passive and active motion. There are no carotid bruits on auscultation. Oropharynx exam reveals: mild mouth dryness, good dental hygiene and moderate airway crowding, due to small airway entry, tonsils on the smaller side, elongated uvula.  Mallampati class II.  Neck circumference of 15 three-quarter inches.  She has a mild to moderate overbite.  Tongue protrudes centrally and palate elevates symmetrically. ? ?Chest: Clear to auscultation without wheezing, rhonchi or crackles noted. ? ?Heart: S1+S2+0, regular and normal without murmurs, rubs or gallops noted.  ? ?Abdomen: Soft, non-tender and non-distended. ? ?Extremities: There is no pitting edema in the distal lower extremities bilaterally.  ? ?Skin: Warm and dry without trophic changes noted.  Some spider veins around the ankles. ? ?Musculoskeletal: exam reveals no obvious joint deformities.  ? ?Neurologically:  ?Mental status: The patient is awake, alert and oriented in all 4 spheres. Her immediate and remote memory, attention, language skills and fund of knowledge are appropriate. There is no evidence of aphasia, agnosia, apraxia or anomia. Speech is clear with normal prosody and enunciation. Thought process is linear. Mood is normal and affect is normal.  ?Cranial nerves II - XII are as described above under HEENT exam.  ?Motor exam: Normal bulk, strength and tone is noted. There is no obvious tremor.   Fine motor skills and coordination: grossly intact.  ?Cerebellar testing: No dysmetria or intention tremor. There is no truncal or gait ataxia.  ?Sensory exam: intact to light touch in the upper and lower

## 2022-02-26 ENCOUNTER — Ambulatory Visit: Payer: Commercial Managed Care - PPO | Admitting: Nurse Practitioner

## 2022-02-26 ENCOUNTER — Encounter: Payer: Self-pay | Admitting: Nurse Practitioner

## 2022-02-26 VITALS — BP 124/80

## 2022-02-26 DIAGNOSIS — R300 Dysuria: Secondary | ICD-10-CM

## 2022-02-26 DIAGNOSIS — Z113 Encounter for screening for infections with a predominantly sexual mode of transmission: Secondary | ICD-10-CM | POA: Diagnosis not present

## 2022-02-26 DIAGNOSIS — N760 Acute vaginitis: Secondary | ICD-10-CM

## 2022-02-26 DIAGNOSIS — N898 Other specified noninflammatory disorders of vagina: Secondary | ICD-10-CM

## 2022-02-26 DIAGNOSIS — B9689 Other specified bacterial agents as the cause of diseases classified elsewhere: Secondary | ICD-10-CM

## 2022-02-26 LAB — WET PREP FOR TRICH, YEAST, CLUE

## 2022-02-26 MED ORDER — METRONIDAZOLE 500 MG PO TABS: 500.0000 mg | ORAL_TABLET | Freq: Two times a day (BID) | ORAL | 0 refills | Status: DC

## 2022-02-26 NOTE — Progress Notes (Signed)
? ?  Acute Office Visit ? ?Subjective:  ? ? Patient ID: Pamela Medina, female    DOB: April 27, 1966, 56 y.o.   MRN: [id number] ? ? ?HPI ?56 y.o. presents today for malodorous urine and pelvic pressure. Had similar symptoms 3/9 with negative urinalysis. Symptoms have still remained. Found out last month boyfriend was unfaithful. Complains of mild vaginal odor without discharge or itching.  ? ? ?Review of Systems  ?Constitutional: Negative.   ?Genitourinary:  Positive for pelvic pain (Pressure). Negative for difficulty urinating, dysuria, flank pain, frequency, hematuria and vaginal discharge.  ?     Malodorous urine and vaginal odor  ? ?   ?Objective:  ?  ?Physical Exam ?Constitutional:   ?   Appearance: Normal appearance.  ?Genitourinary: ?   General: Normal vulva.  ?   Vagina: Vaginal discharge present. No erythema.  ? ? ?BP 124/80  ?Wt Readings from Last 3 Encounters:  ?02/24/22 (!) 316 lb 6.4 oz (143.5 kg)  ?02/05/22 (!) 315 lb (142.9 kg)  ?11/21/21 (!) 316 lb 3.2 oz (143.4 kg)  ? ? ?   ? ?Patient informed chaperone available to be present for breast and pelvic exam. Patient has requested no chaperone to be present. Patient has been advised what will be completed during breast and pelvic exam.  ? ?Wet prep + clue cells (+ odor) ?UA: negative leukocytes, nitrite negative, blood negative, pH 7.5, dark yellow/cloudy. Microscopic: wbc 0-5, rbc none, few bacteria, many amorphous sediment  ? ?Assessment & Plan:  ? ?Problem List Items Addressed This Visit   ?None ?Visit Diagnoses   ? ? Bacterial vaginosis    -  Primary  ? Relevant Medications  ? metroNIDAZOLE (FLAGYL) 500 MG tablet  ? Dysuria      ? Relevant Orders  ? Urinalysis,Complete w/RFL Culture (Completed)  ? Screen for STD (sexually transmitted disease)      ? Relevant Orders  ? SURESWAB CT/NG/T. vaginalis  ? RPR  ? HIV Antibody (routine testing w rflx)  ? Vaginal odor      ? Relevant Orders  ? WET PREP FOR TRICH, YEAST, CLUE  ? ?  ? ?Plan: Wet prep positive for  clue cells - Flagyl 500 mg BID x 7 days. UA negative. STD panel pending.  ? ? ? ? ?Olivia Mackie DNP, 2:50 PM 02/26/2022 ? ?

## 2022-02-27 ENCOUNTER — Encounter: Payer: Self-pay | Admitting: Internal Medicine

## 2022-02-27 ENCOUNTER — Encounter: Payer: Self-pay | Admitting: Neurology

## 2022-02-27 LAB — HIV ANTIBODY (ROUTINE TESTING W REFLEX): HIV 1&2 Ab, 4th Generation: NONREACTIVE

## 2022-02-27 LAB — SURESWAB CT/NG/T. VAGINALIS
C. trachomatis RNA, TMA: NOT DETECTED
N. gonorrhoeae RNA, TMA: NOT DETECTED
Trichomonas vaginalis RNA: NOT DETECTED

## 2022-02-27 LAB — RPR: RPR Ser Ql: NONREACTIVE

## 2022-02-27 NOTE — Telephone Encounter (Signed)
Sleep study results to inbox for review.  ?

## 2022-03-01 LAB — URINALYSIS, COMPLETE W/RFL CULTURE
Bilirubin Urine: NEGATIVE
Glucose, UA: NEGATIVE
Hgb urine dipstick: NEGATIVE
Hyaline Cast: NONE SEEN /LPF
Ketones, ur: NEGATIVE
Leukocyte Esterase: NEGATIVE
Nitrites, Initial: NEGATIVE
Protein, ur: NEGATIVE
RBC / HPF: NONE SEEN /HPF (ref 0–2)
Specific Gravity, Urine: 1.02 (ref 1.001–1.035)
pH: 7.5 (ref 5.0–8.0)

## 2022-03-01 LAB — URINE CULTURE
MICRO NUMBER:: 13315027
SPECIMEN QUALITY:: ADEQUATE

## 2022-03-01 LAB — CULTURE INDICATED

## 2022-03-03 ENCOUNTER — Encounter: Payer: Self-pay | Admitting: Nurse Practitioner

## 2022-03-03 ENCOUNTER — Other Ambulatory Visit: Payer: Self-pay | Admitting: Nurse Practitioner

## 2022-03-03 DIAGNOSIS — N3000 Acute cystitis without hematuria: Secondary | ICD-10-CM

## 2022-03-03 MED ORDER — SULFAMETHOXAZOLE-TRIMETHOPRIM 800-160 MG PO TABS: 1.0000 | ORAL_TABLET | Freq: Two times a day (BID) | ORAL | 0 refills | Status: AC

## 2022-03-03 NOTE — Telephone Encounter (Signed)
I have reviewed patient's home sleep test report from 11/29/2015.  AHI was 18.2/h, O2 nadir 82%. ?

## 2022-03-05 ENCOUNTER — Other Ambulatory Visit: Payer: Self-pay | Admitting: Nurse Practitioner

## 2022-03-05 DIAGNOSIS — B3731 Acute candidiasis of vulva and vagina: Secondary | ICD-10-CM

## 2022-03-05 MED ORDER — FLUCONAZOLE 150 MG PO TABS: 150.0000 mg | ORAL_TABLET | ORAL | 0 refills | Status: DC

## 2022-03-10 ENCOUNTER — Ambulatory Visit: Payer: Commercial Managed Care - PPO | Admitting: Neurology

## 2022-03-10 DIAGNOSIS — R0681 Apnea, not elsewhere classified: Secondary | ICD-10-CM

## 2022-03-10 DIAGNOSIS — G4733 Obstructive sleep apnea (adult) (pediatric): Secondary | ICD-10-CM

## 2022-03-10 DIAGNOSIS — R635 Abnormal weight gain: Secondary | ICD-10-CM

## 2022-03-10 DIAGNOSIS — R519 Headache, unspecified: Secondary | ICD-10-CM

## 2022-03-10 DIAGNOSIS — R351 Nocturia: Secondary | ICD-10-CM

## 2022-04-01 ENCOUNTER — Ambulatory Visit: Payer: Commercial Managed Care - PPO | Admitting: Neurology

## 2022-04-01 DIAGNOSIS — G4733 Obstructive sleep apnea (adult) (pediatric): Secondary | ICD-10-CM | POA: Diagnosis not present

## 2022-04-01 DIAGNOSIS — R0681 Apnea, not elsewhere classified: Secondary | ICD-10-CM

## 2022-04-01 DIAGNOSIS — R351 Nocturia: Secondary | ICD-10-CM

## 2022-04-01 DIAGNOSIS — R519 Headache, unspecified: Secondary | ICD-10-CM

## 2022-04-01 DIAGNOSIS — R635 Abnormal weight gain: Secondary | ICD-10-CM

## 2022-04-01 NOTE — Telephone Encounter (Signed)
Records received and reviewed and notable for the following:  - Colonoscopy (08/11/2018, [facility], [place]): 1 cm sessile serrated adenoma removed from the cecum.  Several additional subcentimeter hyperplastic polyps removed from the ascending, transverse, descending, and rectal.    Based on previous available records, I agree that she is due for repeat colonoscopy for ongoing polyp surveillance.  Okay to schedule direct with me for colonoscopy.

## 2022-04-08 NOTE — Progress Notes (Signed)
See procedure note.

## 2022-04-10 ENCOUNTER — Telehealth: Payer: Self-pay | Admitting: *Deleted

## 2022-04-10 ENCOUNTER — Other Ambulatory Visit: Payer: Self-pay | Admitting: Neurology

## 2022-04-10 DIAGNOSIS — G4733 Obstructive sleep apnea (adult) (pediatric): Secondary | ICD-10-CM

## 2022-04-10 NOTE — Procedures (Signed)
   [facility]  HOME SLEEP TEST (Watch PAT) REPORT  STUDY DATE: 04/01/2022  DOB: 1966/01/17  MRN: [id number]  ORDERING CLINICIAN: Star Age, MD, PhD   REFERRING CLINICIAN: Isaac Bliss, Rayford Halsted, MD   CLINICAL INFORMATION/HISTORY: 56 year old right-handed woman with an underlying medical history of hypothyroidism, vitamin D deficiency, knee pain, colonic polyps, and severe obesity with a BMI of over 41, who reports a prior diagnosis of obstructive sleep apnea.  She presents for reevaluation.  She is currently not on PAP therapy.  Epworth sleepiness score: 7/24.  BMI: 46.7 kg/m  FINDINGS:   Sleep Summary:   Total Recording Time (hours, min): 8 hours, 23 minutes  Total Sleep Time (hours, min):  7 hours, 42 minutes   Percent REM (%):    32.1%   Respiratory Indices:   Calculated pAHI (per hour):  19/hour         REM pAHI:    42.4/hour       NREM pAHI: 7.6/hour  Oxygen Saturation Statistics:    Oxygen Saturation (%) Mean: 94%   Minimum oxygen saturation (%):                 84%   O2 Saturation Range (%): 84-98%    O2 Saturation (minutes) <=88%: 2.5 min  Pulse Rate Statistics:   Pulse Mean (bpm):    75/min    Pulse Range (47-111/min)   IMPRESSION: OSA (obstructive sleep apnea)   RECOMMENDATION:  This home sleep test demonstrates moderate obstructive sleep apnea with a total AHI of 19/hour and O2 nadir of 84%.  Mild to moderate snoring was detected, at times in the louder range.  Treatment with positive airway pressure is recommended. The patient will be advised to proceed with an autoPAP titration/trial at home for now. A full night titration study may be considered to optimize treatment settings, if needed down the road.  Alternative treatment options may include dental treatment with an oral appliance through dentistry or orthodontics, or surgical treatment with Inspire, hypoglossal nerve stimulator, in selected patients.  Concomitant  weight loss is highly recommended. Please note that untreated obstructive sleep apnea may carry additional perioperative morbidity. Patients with significant obstructive sleep apnea should receive perioperative PAP therapy and the surgeons and particularly the anesthesiologist should be informed of the diagnosis and the severity of the sleep disordered breathing. The patient should be cautioned not to drive, work at heights, or operate dangerous or heavy equipment when tired or sleepy. Review and reiteration of good sleep hygiene measures should be pursued with any patient. Other causes of the patient's symptoms, including circadian rhythm disturbances, an underlying mood disorder, medication effect and/or an underlying medical problem cannot be ruled out based on this test. Clinical correlation is recommended. The patient and her referring provider will be notified of the test results. The patient will be seen in follow up in sleep clinic at [facility].  I certify that I have reviewed the raw data recording prior to the issuance of this report in accordance with the standards of the American Academy of Sleep Medicine (AASM).  INTERPRETING PHYSICIAN:   Star Age, MD, PhD  Board Certified in Neurology and Sleep Medicine  [facility] [address], [place], [place] [address] [phone]

## 2022-04-10 NOTE — Telephone Encounter (Signed)
-----   Message from Huston Foley, MD sent at 04/10/2022  2:26 PM EDT ----- Patient referred by Philip Aspen, Limmie Patricia, MD, seen by me on 02/24/2022, patient had a HST on 04/01/2022.    Please call and notify the patient that the recent home sleep test showed obstructive sleep apnea in the moderate range. I recommend treatment in the form of autoPAP, which means, that we don't have to bring her in for a sleep study with CPAP, but will let her start using a so called autoPAP machine at home, which is a CPAP-like machine with self-adjusting pressures. We will send the order to a local DME company (of her choice, or as per insurance requirement). The DME representative will fit her with a mask, educate her on how to use the machine, how to put the mask on, etc. I have placed an order in the chart. Please send the order, talk to patient, send report to referring MD. We will need a FU in sleep clinic for 10 weeks post-PAP set up, please arrange that with me or one of our NPs. Also reinforce the need for compliance with treatment. Thanks,   Huston Foley, MD, PhD [facility])

## 2022-04-10 NOTE — Telephone Encounter (Signed)
Spoke with patient and discussed her sleep study results. Pt is aware her sleep study showed moderate OSA. Pt is amenable to starting autoPAP therapy. She does not have a preference of DME company. Orders sent to [employer]. Patient aware of insurance compliance requirements which includes using the machine at least 4 hours per night and also being seen in the office between 30-90 days after setup. Initial f/u appt scheduled for 06/24/22 at 3:15 PM arrival 15-30 minutes early with machine and power cord. Pt verbalized appreciation for the call. Prefers f/u letter to go to [employer].

## 2022-04-11 ENCOUNTER — Encounter: Payer: Self-pay | Admitting: Gastroenterology

## 2022-05-13 ENCOUNTER — Other Ambulatory Visit: Payer: Self-pay

## 2022-05-13 ENCOUNTER — Ambulatory Visit (AMBULATORY_SURGERY_CENTER): Payer: Self-pay

## 2022-05-13 VITALS — Ht 69.0 in | Wt 323.0 lb

## 2022-05-13 DIAGNOSIS — Z86010 Personal history of colonic polyps: Secondary | ICD-10-CM

## 2022-05-13 MED ORDER — NA SULFATE-K SULFATE-MG SULF 17.5-3.13-1.6 GM/177ML PO SOLN: 1.0000 | Freq: Once | ORAL | 0 refills | Status: AC

## 2022-05-13 NOTE — Progress Notes (Signed)
Denies allergies to eggs or soy products. Denies complication of anesthesia or sedation. Denies use of weight loss medication. Denies use of O2.   Emmi instructions given for colonoscopy.  

## 2022-06-03 ENCOUNTER — Encounter: Payer: Self-pay | Admitting: Gastroenterology

## 2022-06-03 ENCOUNTER — Ambulatory Visit: Payer: Commercial Managed Care - PPO | Admitting: Podiatry

## 2022-06-03 ENCOUNTER — Ambulatory Visit (INDEPENDENT_AMBULATORY_CARE_PROVIDER_SITE_OTHER): Payer: Commercial Managed Care - PPO

## 2022-06-03 DIAGNOSIS — M79672 Pain in left foot: Secondary | ICD-10-CM

## 2022-06-03 DIAGNOSIS — M722 Plantar fascial fibromatosis: Secondary | ICD-10-CM

## 2022-06-03 DIAGNOSIS — M7731 Calcaneal spur, right foot: Secondary | ICD-10-CM

## 2022-06-03 DIAGNOSIS — M79671 Pain in right foot: Secondary | ICD-10-CM

## 2022-06-03 DIAGNOSIS — M7732 Calcaneal spur, left foot: Secondary | ICD-10-CM | POA: Diagnosis not present

## 2022-06-03 MED ORDER — MELOXICAM 15 MG PO TABS: 15.0000 mg | ORAL_TABLET | Freq: Every day | ORAL | 0 refills | Status: DC

## 2022-06-03 MED ORDER — TRIAMCINOLONE ACETONIDE 10 MG/ML IJ SUSP
10.0000 mg | Freq: Once | INTRAMUSCULAR | Status: AC
  Administered 2022-06-03: 10 mg

## 2022-06-03 NOTE — Patient Instructions (Signed)
For instructions on how to put on your Night Splint, please visit [email or web address]   Plantar Fasciitis (Heel Spur Syndrome) with Rehab The plantar fascia is a fibrous, ligament-like, soft-tissue structure that spans the bottom of the foot. Plantar fasciitis is a condition that causes pain in the foot due to inflammation of the tissue. SYMPTOMS   Pain and tenderness on the underneath side of the foot.  Pain that worsens with standing or walking. CAUSES  Plantar fasciitis is caused by irritation and injury to the plantar fascia on the underneath side of the foot. Common mechanisms of injury include:  Direct trauma to bottom of the foot.  Damage to a small nerve that runs under the foot where the main fascia attaches to the heel bone.  Stress placed on the plantar fascia due to bone spurs. RISK INCREASES WITH:   Activities that place stress on the plantar fascia (running, jumping, pivoting, or cutting).  Poor strength and flexibility.  Improperly fitted shoes.  Tight calf muscles.  Flat feet.  Failure to warm-up properly before activity.  Obesity. PREVENTION  Warm up and stretch properly before activity.  Allow for adequate recovery between workouts.  Maintain physical fitness:  Strength, flexibility, and endurance.  Cardiovascular fitness.  Maintain a health body weight.  Avoid stress on the plantar fascia.  Wear properly fitted shoes, including arch supports for individuals who have flat feet.  PROGNOSIS  If treated properly, then the symptoms of plantar fasciitis usually resolve without surgery. However, occasionally surgery is necessary.  RELATED COMPLICATIONS   Recurrent symptoms that may result in a chronic condition.  Problems of the lower back that are caused by compensating for the injury, such as limping.  Pain or weakness of the foot during push-off following surgery.  Chronic inflammation, scarring, and partial or complete fascia tear,  occurring more often from repeated injections.  TREATMENT  Treatment initially involves the use of ice and medication to help reduce pain and inflammation. The use of strengthening and stretching exercises may help reduce pain with activity, especially stretches of the Achilles tendon. These exercises may be performed at home or with a therapist. Your caregiver may recommend that you use heel cups of arch supports to help reduce stress on the plantar fascia. Occasionally, corticosteroid injections are given to reduce inflammation. If symptoms persist for greater than 6 months despite non-surgical (conservative), then surgery may be recommended.   MEDICATION   If pain medication is necessary, then nonsteroidal anti-inflammatory medications, such as aspirin and ibuprofen, or other minor pain relievers, such as acetaminophen, are often recommended.  Do not take pain medication within 7 days before surgery.  Prescription pain relievers may be given if deemed necessary by your caregiver. Use only as directed and only as much as you need.  Corticosteroid injections may be given by your caregiver. These injections should be reserved for the most serious cases, because they may only be given a certain number of times.  HEAT AND COLD  Cold treatment (icing) relieves pain and reduces inflammation. Cold treatment should be applied for 10 to 15 minutes every 2 to 3 hours for inflammation and pain and immediately after any activity that aggravates your symptoms. Use ice packs or massage the area with a piece of ice (ice massage).  Heat treatment may be used prior to performing the stretching and strengthening activities prescribed by your caregiver, physical therapist, or athletic trainer. Use a heat pack or soak the injury in warm water.  SEEK IMMEDIATE MEDICAL CARE   IF:  Treatment seems to offer no benefit, or the condition worsens.  Any medications produce adverse side effects.  EXERCISES- RANGE OF  MOTION (ROM) AND STRETCHING EXERCISES - Plantar Fasciitis (Heel Spur Syndrome) These exercises may help you when beginning to rehabilitate your injury. Your symptoms may resolve with or without further involvement from your physician, physical therapist or athletic trainer. While completing these exercises, remember:   Restoring tissue flexibility helps normal motion to return to the joints. This allows healthier, less painful movement and activity.  An effective stretch should be held for at least 30 seconds.  A stretch should never be painful. You should only feel a gentle lengthening or release in the stretched tissue.  RANGE OF MOTION - Toe Extension, Flexion  Sit with your right / left leg crossed over your opposite knee.  Grasp your toes and gently pull them back toward the top of your foot. You should feel a stretch on the bottom of your toes and/or foot.  Hold this stretch for 10 seconds.  Now, gently pull your toes toward the bottom of your foot. You should feel a stretch on the top of your toes and or foot.  Hold this stretch for 10 seconds. Repeat  times. Complete this stretch 3 times per day.   RANGE OF MOTION - Ankle Dorsiflexion, Active Assisted  Remove shoes and sit on a chair that is preferably not on a carpeted surface.  Place right / left foot under knee. Extend your opposite leg for support.  Keeping your heel down, slide your right / left foot back toward the chair until you feel a stretch at your ankle or calf. If you do not feel a stretch, slide your bottom forward to the edge of the chair, while still keeping your heel down.  Hold this stretch for 10 seconds. Repeat 3 times. Complete this stretch 2 times per day.   STRETCH  Gastroc, Standing  Place hands on wall.  Extend right / left leg, keeping the front knee somewhat bent.  Slightly point your toes inward on your back foot.  Keeping your right / left heel on the floor and your knee straight, shift  your weight toward the wall, not allowing your back to arch.  You should feel a gentle stretch in the right / left calf. Hold this position for 10 seconds. Repeat 3 times. Complete this stretch 2 times per day.  STRETCH  Soleus, Standing  Place hands on wall.  Extend right / left leg, keeping the other knee somewhat bent.  Slightly point your toes inward on your back foot.  Keep your right / left heel on the floor, bend your back knee, and slightly shift your weight over the back leg so that you feel a gentle stretch deep in your back calf.  Hold this position for 10 seconds. Repeat 3 times. Complete this stretch 2 times per day.  STRETCH  Gastrocsoleus, Standing  Note: This exercise can place a lot of stress on your foot and ankle. Please complete this exercise only if specifically instructed by your caregiver.   Place the ball of your right / left foot on a step, keeping your other foot firmly on the same step.  Hold on to the wall or a rail for balance.  Slowly lift your other foot, allowing your body weight to press your heel down over the edge of the step.  You should feel a stretch in your right / left calf.  Hold this position   for 10 seconds.  Repeat this exercise with a slight bend in your right / left knee. Repeat 3 times. Complete this stretch 2 times per day.   STRENGTHENING EXERCISES - Plantar Fasciitis (Heel Spur Syndrome)  These exercises may help you when beginning to rehabilitate your injury. They may resolve your symptoms with or without further involvement from your physician, physical therapist or athletic trainer. While completing these exercises, remember:   Muscles can gain both the endurance and the strength needed for everyday activities through controlled exercises.  Complete these exercises as instructed by your physician, physical therapist or athletic trainer. Progress the resistance and repetitions only as guided.  STRENGTH - Towel Curls  Sit in  a chair positioned on a non-carpeted surface.  Place your foot on a towel, keeping your heel on the floor.  Pull the towel toward your heel by only curling your toes. Keep your heel on the floor. Repeat 3 times. Complete this exercise 2 times per day.  STRENGTH - Ankle Inversion  Secure one end of a rubber exercise band/tubing to a fixed object (table, pole). Loop the other end around your foot just before your toes.  Place your fists between your knees. This will focus your strengthening at your ankle.  Slowly, pull your big toe up and in, making sure the band/tubing is positioned to resist the entire motion.  Hold this position for 10 seconds.  Have your muscles resist the band/tubing as it slowly pulls your foot back to the starting position. Repeat 3 times. Complete this exercises 2 times per day.  Document Released: 10/20/2005 Document Revised: 01/12/2012 Document Reviewed: 02/01/2009 [employer] Patient Information 2014 [employer].  
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
## 2022-06-05 ENCOUNTER — Ambulatory Visit (AMBULATORY_SURGERY_CENTER): Payer: Commercial Managed Care - PPO | Admitting: Gastroenterology

## 2022-06-05 ENCOUNTER — Encounter: Payer: Self-pay | Admitting: Gastroenterology

## 2022-06-05 VITALS — BP 117/64 | HR 65 | Temp 98.4°F | Resp 17 | Ht 69.0 in | Wt 323.0 lb

## 2022-06-05 DIAGNOSIS — Z86010 Personal history of colonic polyps: Secondary | ICD-10-CM | POA: Diagnosis not present

## 2022-06-05 DIAGNOSIS — K641 Second degree hemorrhoids: Secondary | ICD-10-CM

## 2022-06-05 DIAGNOSIS — Z09 Encounter for follow-up examination after completed treatment for conditions other than malignant neoplasm: Secondary | ICD-10-CM | POA: Diagnosis present

## 2022-06-05 MED ORDER — SODIUM CHLORIDE 0.9 % IV SOLN: 500.0000 mL | Freq: Once | INTRAVENOUS | Status: DC
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
## 2022-06-05 NOTE — Progress Notes (Signed)
[phone] 

## 2022-06-05 NOTE — Patient Instructions (Signed)
Discharge instructioYOU HAD AN ENDOSCOPIC PROCEDURE TODAY AT THE [facility]:   Refer to the procedure report that was given to you for any specific questions about what was found during the examination.  If the procedure report does not answer your questions, please call your gastroenterologist to clarify.  If you requested that your care partner not be given the details of your procedure findings, then the procedure report has been included in a sealed envelope for you to review at your convenience later.  YOU SHOULD EXPECT: Some feelings of bloating in the abdomen. Passage of more gas than usual.  Walking can help get rid of the air that was put into your GI tract during the procedure and reduce the bloating. If you had a lower endoscopy (such as a colonoscopy or flexible sigmoidoscopy) you may notice spotting of blood in your stool or on the toilet paper. If you underwent a bowel prep for your procedure, you may not have a normal bowel movement for a few days.  Please Note:  You might notice some irritation and congestion in your nose or some drainage.  This is from the oxygen used during your procedure.  There is no need for concern and it should clear up in a day or so.  SYMPTOMS TO REPORT IMMEDIATELY:  Following lower endoscopy (colonoscopy or flexible sigmoidoscopy):  Excessive amounts of blood in the stool  Significant tenderness or worsening of abdominal pains  Swelling of the abdomen that is new, acute  Fever of 100F or higher   For urgent or emergent issues, a gastroenterologist can be reached at any hour by calling [phone]. Do not use MyChart messaging for urgent concerns.    DIET:  We do recommend a small meal at first, but then you may proceed to your regular diet.  Drink plenty of fluids but you should avoid alcoholic beverages for 24 hours.  ACTIVITY:  You should plan to take it easy for the rest of today and you should NOT DRIVE or use heavy machinery until  tomorrow (because of the sedation medicines used during the test).    FOLLOW UP: Our staff will call the number listed on your records the next business day following your procedure.  We will call around 7:15- 8:00 am to check on you and address any questions or concerns that you may have regarding the information given to you following your procedure. If we do not reach you, we will leave a message.  If you develop any symptoms (ie: fever, flu-like symptoms, shortness of breath, cough etc.) before then, please call [phone].  If you test positive for Covid 19 in the 2 weeks post procedure, please call and report this information to [place].    If any biopsies were taken you will be contacted by phone or by letter within the next 1-3 weeks.  Please call us at [phone] if you have not heard about the biopsies in 3 weeks.    SIGNATURES/CONFIDENTIALITY: You and/or your care partner have signed paperwork which will be entered into your electronic medical record.  These signatures attest to the fact that that the information above on your After Visit Summary has been reviewed and is understood.  Full responsibility of the confidentiality of this discharge information lies with you and/or your care-partner. ns given. Handout on Hemorrhoids. Resume previous medications.

## 2022-06-05 NOTE — Progress Notes (Signed)
A/ox3, pleased with MAC, report to RN 

## 2022-06-05 NOTE — Op Note (Signed)
[facility] Patient Name: Pamela Medina Procedure Date: 06/05/2022 9:29 AM MRN: [id number] Endoscopist: Doristine Locks , MD Age: 56 Referring MD:  Date of Birth: 04-Aug-1966 Gender: Female Account #: [id number] Procedure:                Colonoscopy Indications:              High risk colon cancer surveillance: Personal                            history of sessile serrated colon polyp (10 mm or                            greater in size)                           Colonoscopy (08/11/2018, [facility],                            [place]): 1 cm sessile serrated adenoma                            removed from the cecum. Several additional                            subcentimeter hyperplastic polyps removed from the                            ascending, transverse, descending, and rectal. Medicines:                Monitored Anesthesia Care Procedure:                Pre-Anesthesia Assessment:                           - Prior to the procedure, a History and Physical                            was performed, and patient medications and                            allergies were reviewed. The patient's tolerance of                            previous anesthesia was also reviewed. The risks                            and benefits of the procedure and the sedation                            options and risks were discussed with the patient.                            All questions were answered, and informed consent  was obtained. Prior Anticoagulants: The patient has                            taken no previous anticoagulant or antiplatelet                            agents. ASA Grade Assessment: III - A patient with                            severe systemic disease. After reviewing the risks                            and benefits, the patient was deemed in                            satisfactory condition to undergo the  procedure.                           After obtaining informed consent, the colonoscope                            was passed under direct vision. Throughout the                            procedure, the patient's blood pressure, pulse, and                            oxygen saturations were monitored continuously. The                            Olympus PCF-[id number]) Colonoscope was                            introduced through the anus and advanced to the the                            cecum, identified by appendiceal orifice and                            ileocecal valve. The colonoscopy was performed                            without difficulty. The patient tolerated the                            procedure well. The quality of the bowel                            preparation was good. The ileocecal valve,                            appendiceal orifice, and rectum were photographed. Scope In: 9:45:37 AM Scope Out: 10:05:07 AM Scope Withdrawal Time: 0 hours 11 minutes 9 seconds  Total Procedure Duration: 0  hours 19 minutes 30 seconds  Findings:                 Hemorrhoids were found on perianal exam.                           There was a medium-sized lipoma, in the ascending                            colon.                           The colon otherwise appeared normal throughout.                           The ascending colon revealed moderately excessive                            looping. Advancing the scope required using manual                            pressure.                           Non-bleeding internal hemorrhoids were found during                            retroflexion. The hemorrhoids were small and Grade                            II (internal hemorrhoids that prolapse but reduce                            spontaneously). Complications:            No immediate complications. Estimated Blood Loss:     Estimated blood loss: none. Impression:               -  Hemorrhoids found on perianal exam.                           - Medium-sized lipoma in the ascending colon.                           - The entire examined colon is normal.                           - There was significant looping of the colon.                           - Non-bleeding internal hemorrhoids.                           - No specimens collected. Recommendation:           - Patient has a contact number available for                            emergencies.  The signs and symptoms of potential                            delayed complications were discussed with the                            patient. Return to normal activities tomorrow.                            Written discharge instructions were provided to the                            patient.                           - Resume previous diet.                           - Continue present medications.                           - Repeat colonoscopy in 5 years for surveillance                            due to history of sessile serrate adenoma on                            previous study.                           - Return to GI clinic PRN.                           - Use fiber, for example Citrucel, Fibercon, Konsyl                            or Metamucil. Doristine Locks, MD 06/05/2022 10:11:20 AM
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
## 2022-06-24 ENCOUNTER — Encounter: Payer: Self-pay | Admitting: Adult Health

## 2022-06-24 ENCOUNTER — Ambulatory Visit: Payer: Commercial Managed Care - PPO | Admitting: Adult Health

## 2022-06-24 VITALS — BP 128/80 | HR 66 | Ht 70.0 in | Wt 321.0 lb

## 2022-06-24 DIAGNOSIS — Z9989 Dependence on other enabling machines and devices: Secondary | ICD-10-CM

## 2022-06-24 DIAGNOSIS — G4733 Obstructive sleep apnea (adult) (pediatric): Secondary | ICD-10-CM | POA: Diagnosis not present
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
## 2022-06-24 NOTE — Patient Instructions (Signed)
Continue nightly use of CPAP - continue to follow with your DME company [employer] for any needed supplies or CPAP related concerns   Follow up in 6 months or call earlier if needed

## 2022-06-25 ENCOUNTER — Other Ambulatory Visit: Payer: Self-pay | Admitting: Podiatry

## 2022-06-25 DIAGNOSIS — M722 Plantar fascial fibromatosis: Secondary | ICD-10-CM

## 2022-07-03 ENCOUNTER — Other Ambulatory Visit: Payer: Self-pay | Admitting: Podiatry

## 2022-07-15 ENCOUNTER — Ambulatory Visit: Payer: Commercial Managed Care - PPO | Admitting: Podiatry

## 2022-07-23 NOTE — Progress Notes (Signed)
-----   Message -----  From: Trinidad Curet, CMA  Sent: 06/24/2022   3:38 PM EDT  To: Orrin Brigham Hepler  Subject: Cpap orders                                     New orders have been placed for the above pt, DOB: July 31, 1966  Thanks

## 2022-08-01 ENCOUNTER — Other Ambulatory Visit: Payer: Self-pay | Admitting: Internal Medicine

## 2022-08-01 DIAGNOSIS — Z1231 Encounter for screening mammogram for malignant neoplasm of breast: Secondary | ICD-10-CM

## 2022-08-02 ENCOUNTER — Other Ambulatory Visit: Payer: Self-pay | Admitting: Podiatry

## 2022-08-22 ENCOUNTER — Ambulatory Visit: Payer: Commercial Managed Care - PPO | Admitting: Podiatry

## 2022-09-11 ENCOUNTER — Ambulatory Visit
Admission: RE | Admit: 2022-09-11 | Discharge: 2022-09-11 | Disposition: A | Discharge status: Home / Self Care | Payer: Commercial Managed Care - PPO | Source: Ambulatory Visit | Attending: Internal Medicine | Admitting: Internal Medicine

## 2022-09-11 DIAGNOSIS — Z1231 Encounter for screening mammogram for malignant neoplasm of breast: Secondary | ICD-10-CM

## 2022-09-16 ENCOUNTER — Ambulatory Visit: Payer: Commercial Managed Care - PPO | Admitting: Podiatry

## 2022-10-14 ENCOUNTER — Encounter: Payer: Self-pay | Admitting: Internal Medicine

## 2022-10-15 ENCOUNTER — Other Ambulatory Visit: Payer: Self-pay

## 2022-10-15 MED ORDER — LEVOTHYROXINE SODIUM 50 MCG PO TABS: 50.0000 ug | ORAL_TABLET | Freq: Every day | ORAL | 1 refills | Status: DC

## 2022-12-10 ENCOUNTER — Other Ambulatory Visit: Payer: Self-pay | Admitting: Podiatry
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
## 2022-12-15 ENCOUNTER — Telehealth: Payer: Commercial Managed Care - PPO | Admitting: Adult Health

## 2023-01-09 ENCOUNTER — Ambulatory Visit
Admission: EM | Admit: 2023-01-09 | Discharge: 2023-01-09 | Disposition: A | Discharge status: Home / Self Care | Payer: Commercial Managed Care - PPO | Attending: Nurse Practitioner | Admitting: Nurse Practitioner

## 2023-01-09 DIAGNOSIS — H6991 Unspecified Eustachian tube disorder, right ear: Secondary | ICD-10-CM

## 2023-01-09 MED ORDER — FLUTICASONE PROPIONATE 50 MCG/ACT NA SUSP: 1.0000 | Freq: Every day | NASAL | 0 refills | Status: DC

## 2023-01-09 NOTE — Discharge Instructions (Signed)
Start Flonase daily Over-the-counter decongestant such as Sudafed Follow-up with your PCP if your symptoms do not improve Please go to the emergency room for any worsening symptoms

## 2023-01-09 NOTE — ED Triage Notes (Signed)
Pt c/o right sided ear pressure that began Wednesday. Home interventions: aleve
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
## 2023-01-15 ENCOUNTER — Ambulatory Visit: Payer: Commercial Managed Care - PPO | Admitting: Adult Health

## 2023-01-15 ENCOUNTER — Encounter: Payer: Self-pay | Admitting: Adult Health

## 2023-01-15 ENCOUNTER — Ambulatory Visit: Payer: Commercial Managed Care - PPO | Admitting: Internal Medicine

## 2023-01-15 VITALS — BP 128/82 | HR 79 | Temp 98.3°F | Ht 70.0 in

## 2023-01-15 DIAGNOSIS — H6991 Unspecified Eustachian tube disorder, right ear: Secondary | ICD-10-CM | POA: Diagnosis not present

## 2023-01-15 DIAGNOSIS — N644 Mastodynia: Secondary | ICD-10-CM
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
## 2023-01-16 ENCOUNTER — Other Ambulatory Visit: Payer: Self-pay | Admitting: Adult Health

## 2023-01-16 DIAGNOSIS — N644 Mastodynia: Secondary | ICD-10-CM

## 2023-01-28 ENCOUNTER — Other Ambulatory Visit: Payer: Self-pay | Admitting: Adult Health

## 2023-01-28 ENCOUNTER — Ambulatory Visit
Admission: RE | Admit: 2023-01-28 | Discharge: 2023-01-28 | Disposition: A | Discharge status: Home / Self Care | Payer: Commercial Managed Care - PPO | Source: Ambulatory Visit | Attending: Adult Health | Admitting: Adult Health

## 2023-01-28 DIAGNOSIS — N644 Mastodynia: Secondary | ICD-10-CM

## 2023-01-28 DIAGNOSIS — N6310 Unspecified lump in the right breast, unspecified quadrant: Secondary | ICD-10-CM

## 2023-02-01 ENCOUNTER — Encounter: Payer: Self-pay | Admitting: Adult Health

## 2023-02-02 ENCOUNTER — Encounter: Payer: Self-pay | Admitting: Internal Medicine

## 2023-02-03 NOTE — Telephone Encounter (Signed)
Please advise 

## 2023-02-11 ENCOUNTER — Ambulatory Visit: Payer: Commercial Managed Care - PPO | Admitting: Internal Medicine

## 2023-02-16 ENCOUNTER — Ambulatory Visit: Payer: Commercial Managed Care - PPO | Admitting: Internal Medicine

## 2023-02-16 ENCOUNTER — Encounter: Payer: Self-pay | Admitting: Internal Medicine

## 2023-02-16 VITALS — BP 128/74 | HR 73 | Ht 70.0 in | Wt 329.6 lb

## 2023-02-16 DIAGNOSIS — E890 Postprocedural hypothyroidism: Secondary | ICD-10-CM | POA: Diagnosis not present

## 2023-02-16 LAB — T4, FREE: Free T4: 0.87 ng/dL (ref 0.60–1.60)

## 2023-02-16 LAB — TSH: TSH: 2.36 u[IU]/mL (ref 0.35–5.50)
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
## 2023-02-16 NOTE — Patient Instructions (Signed)

## 2023-02-17 MED ORDER — LEVOTHYROXINE SODIUM 50 MCG PO TABS: 50.0000 ug | ORAL_TABLET | Freq: Every day | ORAL | 3 refills | Status: DC

## 2023-03-04 ENCOUNTER — Ambulatory Visit: Payer: Commercial Managed Care - PPO | Admitting: Family Medicine

## 2023-03-04 ENCOUNTER — Other Ambulatory Visit: Payer: Self-pay

## 2023-03-04 VITALS — BP 132/82 | HR 75 | Ht 70.0 in | Wt 336.0 lb

## 2023-03-04 DIAGNOSIS — M722 Plantar fascial fibromatosis: Secondary | ICD-10-CM | POA: Diagnosis not present

## 2023-03-04 DIAGNOSIS — M25562 Pain in left knee: Secondary | ICD-10-CM

## 2023-03-04 DIAGNOSIS — M170 Bilateral primary osteoarthritis of knee: Secondary | ICD-10-CM | POA: Diagnosis not present

## 2023-03-04 DIAGNOSIS — G8929 Other chronic pain: Secondary | ICD-10-CM | POA: Diagnosis not present

## 2023-03-04 DIAGNOSIS — M25561 Pain in right knee: Secondary | ICD-10-CM

## 2023-03-04 NOTE — Patient Instructions (Addendum)
Thank you for coming in today.   Call or go to the ER if you develop a large red swollen joint with extreme pain or oozing puss.    Maximize conservative management options for Plantar fasciitis including heel cushion, heel exercise, night splints, and ice.   Night Splint on [employer].   Let me know how this goes.

## 2023-03-04 NOTE — Progress Notes (Unsigned)
Rubin Payor, PhD, LAT, ATC acting as a scribe for [name], MD.  Pamela Medina is a 57 y.o. female who presents to [facility] at [facility] today for bilat knee and feet pain.  She reports bilat knee pain ongoing at least the last 3 years. She locates pain to the anterior aspect of both knees.  Knee swelling: yes Mechanical symptoms: yes Aggravates: stairs, transitioning to stand Treatments tried: prior steroid injection  Pt also c/u pain in bilat feet, L>R. She has been seen previously for her foot pain by podiatry. Pt locates pain to the plantar aspect of both calcaneous. Pain is constant.    Aggravates: 1st step in the mornings Treatments tried: IBU, meloxicam,   Dx imaging: 06/03/22 R & L foot XR  07/05/20 R & L knee XR  Pertinent review of systems: No fevers or chills  Relevant historical information: Hypothyroidism.  Obesity.   Exam:  BP 132/82   Pulse 75   Ht 5\' 10"  (1.778 m)   Wt (!) 336 lb (152.4 kg)   SpO2 96%   BMI 48.21 kg/m  General: Well Developed, well nourished, and in no acute distress.   MSK: Knees bilaterally mild effusion normal-appearing otherwise. Normal motion with crepitation.  Tender palpation medial joint line.  Left foot: Tender palpation plantar calcaneus.  Normal foot and ankle motion.    Lab and Radiology Results  Procedure: Real-time Ultrasound Guided Injection of right knee joint superior lateral patellar space Device: Philips Affiniti 50G Images permanently stored and available for review in PACS Verbal informed consent obtained.  Discussed risks and benefits of procedure. Warned about infection, bleeding, hyperglycemia damage to structures among others. Patient expresses understanding and agreement Time-out conducted.   Noted no overlying erythema, induration, or other signs of local infection.   Skin prepped in a sterile fashion.   Local anesthesia: Topical Ethyl chloride.   With sterile technique  and under real time ultrasound guidance: 40 mg of Kenalog and 2 mL Marcaine injected into knee joint. Fluid seen entering the joint capsule.   Completed without difficulty   Pain immediately resolved suggesting accurate placement of the medication.   Advised to call if fevers/chills, erythema, induration, drainage, or persistent bleeding.   Images permanently stored and available for review in the ultrasound unit.  Impression: Technically successful ultrasound guided injection.    Procedure: Real-time Ultrasound Guided Injection of left knee joint superior lateral patellar space Device: Philips Affiniti 50G Images permanently stored and available for review in PACS Verbal informed consent obtained.  Discussed risks and benefits of procedure. Warned about infection, bleeding, hyperglycemia damage to structures among others. Patient expresses understanding and agreement Time-out conducted.   Noted no overlying erythema, induration, or other signs of local infection.   Skin prepped in a sterile fashion.   Local anesthesia: Topical Ethyl chloride.   With sterile technique and under real time ultrasound guidance: 40 mg of Kenalog and 2 mL Marcaine injected into knee joint. Fluid seen entering the capsule.   Completed without difficulty   Pain immediately resolved suggesting accurate placement of the medication.   Advised to call if fevers/chills, erythema, induration, drainage, or persistent bleeding.   Images permanently stored and available for review in the ultrasound unit.  Impression: Technically successful ultrasound guided injection.   X-ray images bilateral feet obtained at podiatry dated June 03, 2022 personally and independently interpreted. Bilateral plantar and posterior calcaneal heel spurs are present.  No acute fractures are visible.  Assessment and Plan: 57 y.o. 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  Plan for repeat steroid injections today.  If  these shots do not last 3 months she will let me know and we can work on authorization of hyaluronic acid injections and/or Zilretta injections.  Left foot pain due to Planter fasciitis.  Plan to maximize conservative management options.  If not improved consider shockwave treatment versus steroid injection.   PDMP not reviewed this encounter. Orders Placed This Encounter  Procedures   [place] LIMITED JOINT SPACE STRUCTURES LOW BILAT(NO LINKED CHARGES)    Order Specific Question:   Reason for Exam (SYMPTOM  OR DIAGNOSIS REQUIRED)    Answer:   bilateral knee pain    Order Specific Question:   Preferred imaging location?    Answer:   [facility]   No orders of the defined types were placed in this encounter.    Discussed warning signs or symptoms. Please see discharge instructions. Patient expresses understanding.   The above documentation has been reviewed and is accurate and complete [name], M.D.

## 2023-03-05 DIAGNOSIS — M170 Bilateral primary osteoarthritis of knee: Secondary | ICD-10-CM | POA: Insufficient documentation

## 2023-03-06 ENCOUNTER — Ambulatory Visit: Payer: Commercial Managed Care - PPO | Admitting: Adult Health

## 2023-04-08 ENCOUNTER — Other Ambulatory Visit: Payer: Self-pay | Admitting: Podiatry

## 2023-05-08 ENCOUNTER — Other Ambulatory Visit: Payer: Self-pay | Admitting: Podiatry

## 2023-05-11 NOTE — Telephone Encounter (Signed)
Patient needs to be seen by Dr. Ardelle Anton before refills can be sent

## 2023-06-10 ENCOUNTER — Encounter: Payer: Self-pay | Admitting: Internal Medicine

## 2023-06-10 ENCOUNTER — Ambulatory Visit: Payer: Commercial Managed Care - PPO | Admitting: Internal Medicine

## 2023-06-10 VITALS — BP 142/82 | HR 79 | Temp 97.5°F | Wt 326.1 lb

## 2023-06-10 DIAGNOSIS — R030 Elevated blood-pressure reading, without diagnosis of hypertension: Secondary | ICD-10-CM

## 2023-06-10 DIAGNOSIS — G8929 Other chronic pain: Secondary | ICD-10-CM | POA: Diagnosis not present

## 2023-06-10 DIAGNOSIS — G5603 Carpal tunnel syndrome, bilateral upper limbs: Secondary | ICD-10-CM | POA: Diagnosis not present

## 2023-06-10 DIAGNOSIS — M25512 Pain in left shoulder: Secondary | ICD-10-CM
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
## 2023-06-22 ENCOUNTER — Encounter: Payer: Self-pay | Admitting: Internal Medicine

## 2023-07-16 NOTE — Progress Notes (Signed)
I, Stevenson Clinch, CMA acting as a scribe for Pamela Graham, MD.  Pamela Medina is a 57 y.o. female who presents to [facility] at [facility] today for L knee pain. Pt was last seen by Dr. Denyse Amass on 03/04/23 for bilat knee pain and was given bilat knee steroid injections.  Today, pt reports worsening left knee pain. Sx started to return end of June early July. Mechanical sx and swelling present. Locates pain to peripatellar region.   Dx imaging:  07/05/20 R & L knee XR   Pertinent review of systems: No fevers or chills  Relevant historical information: Hypothyroidism   Exam:  BP 118/84   Pulse 73   Ht 5\' 10"  (1.778 m)   Wt (!) 328 lb (148.8 kg)   SpO2 94%   BMI 47.06 kg/m  General: Well Developed, well nourished, and in no acute distress.   MSK: Knees bilaterally minimal effusion.  Normal motion. Intact strength. Tender palpation anterior left knee at distal patellar tendon. Stable ligamentous exam bilaterally.    Lab and Radiology Results  Procedure: Real-time Ultrasound Guided Injection of left knee joint superior lateral patella space Device: Philips Affiniti 50G/GE Logiq Images permanently stored and available for review in PACS Ultrasound evaluation of the knee prior to injection shows mild effusion.  No severe tendinitis of the patellar tendon insertion site pain. Verbal informed consent obtained.  Discussed risks and benefits of procedure. Warned about infection, bleeding, hyperglycemia damage to structures among others. Patient expresses understanding and agreement Time-out conducted.   Noted no overlying erythema, induration, or other signs of local infection.   Skin prepped in a sterile fashion.   Local anesthesia: Topical Ethyl chloride.   With sterile technique and under real time ultrasound guidance: 40 mg of Kenalog and 2 mL of Marcaine injected into knee joint. Fluid seen entering the joint capsule.   Completed without difficulty   Pain  immediately resolved suggesting accurate placement of the medication.   Advised to call if fevers/chills, erythema, induration, drainage, or persistent bleeding.   Images permanently stored and available for review in the ultrasound unit.  Impression: Technically successful ultrasound guided injection.   Procedure: Real-time Ultrasound Guided Injection of right knee joint superior lateral patella space Device: Philips Affiniti 50G/GE Logiq Images permanently stored and available for review in PACS Verbal informed consent obtained.  Discussed risks and benefits of procedure. Warned about infection, bleeding, hyperglycemia damage to structures among others. Patient expresses understanding and agreement Time-out conducted.   Noted no overlying erythema, induration, or other signs of local infection.   Skin prepped in a sterile fashion.   Local anesthesia: Topical Ethyl chloride.   With sterile technique and under real time ultrasound guidance: 40 mg of Kenalog and 2 mL of Marcaine injected into knee joint. Fluid seen entering the joint capsule.   Completed without difficulty   Pain immediately resolved suggesting accurate placement of the medication.   Advised to call if fevers/chills, erythema, induration, drainage, or persistent bleeding.   Images permanently stored and available for review in the ultrasound unit.  Impression: Technically successful ultrasound guided injection.   X-ray images bilateral knees obtained today personally and independently interpreted  Right knee: Mild lateral DJD.  Mild patellofemoral DJD.  Left knee: Mild lateral DJD.  Mild to moderate patellofemoral DJD.  Await formal radiology review    Assessment and Plan: 57 y.o. female with bilateral knee pain due to exacerbation of DJD.  She may also have a component  of patellar tendinitis special on the left.  Plan for repeat steroid injections bilateral knees continued home exercise program focusing on quad  strength.  Consider physical therapy or aquatic physical therapy in the future.  Check back as needed.  We can repeat the steroid injections every 3 months if needed.   PDMP not reviewed this encounter. Orders Placed This Encounter  Procedures   [place] LIMITED JOINT SPACE STRUCTURES LOW LEFT(NO LINKED CHARGES)    Order Specific Question:   Reason for Exam (SYMPTOM  OR DIAGNOSIS REQUIRED)    Answer:   left knee pain    Order Specific Question:   Preferred imaging location?    Answer:   [facility] Knee AP/LAT W/Sunrise Right    Standing Status:   Future    Number of Occurrences:   1    Standing Expiration Date:   08/16/2023    Order Specific Question:   Reason for Exam (SYMPTOM  OR DIAGNOSIS REQUIRED)    Answer:   bilat knee pain    Order Specific Question:   Preferred imaging location?    Answer:   [facility]    Order Specific Question:   Is patient pregnant?    Answer:   No   DG Knee AP/LAT W/Sunrise Left    Standing Status:   Future    Number of Occurrences:   1    Standing Expiration Date:   08/16/2023    Order Specific Question:   Reason for Exam (SYMPTOM  OR DIAGNOSIS REQUIRED)    Answer:   bilat knee pain    Order Specific Question:   Preferred imaging location?    Answer:   [facility]    Order Specific Question:   Is patient pregnant?    Answer:   No   No orders of the defined types were placed in this encounter.    Discussed warning signs or symptoms. Please see discharge instructions. Patient expresses understanding.   The above documentation has been reviewed and is accurate and complete Pamela Medina, M.D.

## 2023-07-17 ENCOUNTER — Ambulatory Visit (INDEPENDENT_AMBULATORY_CARE_PROVIDER_SITE_OTHER): Payer: Commercial Managed Care - PPO

## 2023-07-17 ENCOUNTER — Encounter: Payer: Self-pay | Admitting: Family Medicine

## 2023-07-17 ENCOUNTER — Other Ambulatory Visit: Payer: Self-pay

## 2023-07-17 ENCOUNTER — Ambulatory Visit: Payer: Commercial Managed Care - PPO | Admitting: Family Medicine

## 2023-07-17 VITALS — BP 118/84 | HR 73 | Ht 70.0 in | Wt 328.0 lb

## 2023-07-17 DIAGNOSIS — G8929 Other chronic pain: Secondary | ICD-10-CM

## 2023-07-17 DIAGNOSIS — M25561 Pain in right knee: Secondary | ICD-10-CM

## 2023-07-17 DIAGNOSIS — M25562 Pain in left knee: Secondary | ICD-10-CM | POA: Diagnosis not present

## 2023-07-17 NOTE — Patient Instructions (Addendum)
Thank you for coming in today.  Please get an Xray today before you leave   Call or go to the ER if you develop a large red swollen joint with extreme pain or oozing puss.    I can do it again in 3 months (mid Dec)  We can add PT or Aquatic PT with a mychart message or phone call.

## 2023-07-21 NOTE — Progress Notes (Signed)
Right knee x-ray shows mild arthritis.

## 2023-07-21 NOTE — Progress Notes (Signed)
Left knee x-ray shows mild arthritis.

## 2023-07-31 ENCOUNTER — Other Ambulatory Visit: Payer: Commercial Managed Care - PPO

## 2023-08-11 ENCOUNTER — Encounter: Payer: Self-pay | Admitting: Internal Medicine

## 2023-08-12 ENCOUNTER — Ambulatory Visit: Payer: Commercial Managed Care - PPO | Admitting: Internal Medicine

## 2023-08-12 ENCOUNTER — Encounter: Payer: Self-pay | Admitting: Internal Medicine

## 2023-08-12 ENCOUNTER — Other Ambulatory Visit (HOSPITAL_COMMUNITY)
Admission: RE | Admit: 2023-08-12 | Discharge: 2023-08-12 | Disposition: A | Discharge status: Home / Self Care | Payer: Commercial Managed Care - PPO | Source: Ambulatory Visit | Attending: Internal Medicine | Admitting: Internal Medicine

## 2023-08-12 VITALS — BP 142/70 | HR 58 | Temp 97.9°F | Ht 69.5 in | Wt 324.0 lb

## 2023-08-12 DIAGNOSIS — E890 Postprocedural hypothyroidism: Secondary | ICD-10-CM | POA: Diagnosis not present

## 2023-08-12 DIAGNOSIS — Z0000 Encounter for general adult medical examination without abnormal findings: Secondary | ICD-10-CM | POA: Insufficient documentation

## 2023-08-12 DIAGNOSIS — I10 Essential (primary) hypertension: Secondary | ICD-10-CM | POA: Diagnosis not present

## 2023-08-12 DIAGNOSIS — Z1159 Encounter for screening for other viral diseases: Secondary | ICD-10-CM

## 2023-08-12 DIAGNOSIS — E559 Vitamin D deficiency, unspecified: Secondary | ICD-10-CM | POA: Diagnosis not present

## 2023-08-12 DIAGNOSIS — Z23 Encounter for immunization: Secondary | ICD-10-CM | POA: Diagnosis not present

## 2023-08-12 LAB — COMPREHENSIVE METABOLIC PANEL
ALT: 16 U/L (ref 0–35)
AST: 13 U/L (ref 0–37)
Albumin: 4.1 g/dL (ref 3.5–5.2)
Alkaline Phosphatase: 126 U/L — ABNORMAL HIGH (ref 39–117)
BUN: 12 mg/dL (ref 6–23)
CO2: 33 meq/L — ABNORMAL HIGH (ref 19–32)
Calcium: 9.6 mg/dL (ref 8.4–10.5)
Chloride: 97 meq/L (ref 96–112)
Creatinine, Ser: 0.91 mg/dL (ref 0.40–1.20)
GFR: 71.14 mL/min (ref >60.00)
Glucose, Bld: 91 mg/dL (ref 70–99)
Potassium: 3.5 meq/L (ref 3.5–5.1)
Sodium: 137 meq/L (ref 135–145)
Total Bilirubin: 0.6 mg/dL (ref 0.2–1.2)
Total Protein: 7.5 g/dL (ref 6.0–8.3)

## 2023-08-12 MED ORDER — AMLODIPINE BESYLATE 5 MG PO TABS
5.0000 mg | ORAL_TABLET | Freq: Every day | ORAL | 1 refills | Status: DC
Start: 2023-08-12 — End: 2023-11-16

## 2023-08-12 NOTE — Addendum Note (Signed)
Addended by: Johnella Moloney on: 08/12/2023 02:37 PM   Modules accepted: Orders

## 2023-08-12 NOTE — Addendum Note (Signed)
Addended by: Henderson Cloud on: 08/12/2023 03:46 PM   Modules accepted: Orders
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
## 2023-08-13 LAB — HEPATITIS C ANTIBODY: Hepatitis C Ab: NONREACTIVE

## 2023-08-17 LAB — CYTOLOGY - PAP
Adequacy: ABSENT
Comment: NEGATIVE
Diagnosis: NEGATIVE
High risk HPV: NEGATIVE

## 2023-08-20 ENCOUNTER — Encounter: Payer: Self-pay | Admitting: Internal Medicine

## 2023-08-31 ENCOUNTER — Encounter: Payer: Self-pay | Admitting: Internal Medicine

## 2023-09-09 ENCOUNTER — Ambulatory Visit (HOSPITAL_COMMUNITY)
Admission: EM | Admit: 2023-09-09 | Discharge: 2023-09-09 | Disposition: A | Discharge status: Home / Self Care | Payer: Commercial Managed Care - PPO | Attending: Sports Medicine | Admitting: Sports Medicine

## 2023-09-09 ENCOUNTER — Encounter (HOSPITAL_COMMUNITY): Payer: Self-pay | Admitting: *Deleted

## 2023-09-09 ENCOUNTER — Other Ambulatory Visit: Payer: Self-pay

## 2023-09-09 DIAGNOSIS — R002 Palpitations: Secondary | ICD-10-CM | POA: Diagnosis not present
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
## 2023-09-09 NOTE — ED Triage Notes (Signed)
Pt reports since yesterday feeling heart flutter and CP . Pt started on amlodipine  5 mg once a day 3 weeks ago. Pt also feels dizzy starting today.

## 2023-09-09 NOTE — Discharge Instructions (Addendum)
You were seen for palpitations. Your physical exam was normal and the EKG we did was normal aside from one premature atrial contraction. This may be what is causing your feeling of chest fluttering. If these palpitations continue, I recommend you follow-up with your primary care doctor in the next week to have a holter monitor performed for more prolonged monitoring of your heart rate.  Continue the Amlodipine for now, however your primary care doctor may recommend changing to another blood pressure medication if these continue.

## 2023-10-28 ENCOUNTER — Emergency Department (HOSPITAL_COMMUNITY): Payer: Commercial Managed Care - PPO

## 2023-10-28 ENCOUNTER — Emergency Department (HOSPITAL_COMMUNITY)
Admission: EM | Admit: 2023-10-28 | Discharge: 2023-10-29 | Disposition: A | Discharge status: Other Acute Inpatient Hospital | Payer: Commercial Managed Care - PPO | Attending: Emergency Medicine | Admitting: Emergency Medicine

## 2023-10-28 ENCOUNTER — Other Ambulatory Visit: Payer: Self-pay

## 2023-10-28 DIAGNOSIS — E039 Hypothyroidism, unspecified: Secondary | ICD-10-CM | POA: Insufficient documentation

## 2023-10-28 DIAGNOSIS — D649 Anemia, unspecified: Secondary | ICD-10-CM | POA: Diagnosis not present

## 2023-10-28 DIAGNOSIS — I959 Hypotension, unspecified: Secondary | ICD-10-CM | POA: Insufficient documentation

## 2023-10-28 DIAGNOSIS — S20211A Contusion of right front wall of thorax, initial encounter: Secondary | ICD-10-CM | POA: Insufficient documentation

## 2023-10-28 DIAGNOSIS — X58XXXA Exposure to other specified factors, initial encounter: Secondary | ICD-10-CM | POA: Diagnosis not present

## 2023-10-28 DIAGNOSIS — S299XXA Unspecified injury of thorax, initial encounter: Secondary | ICD-10-CM | POA: Diagnosis present

## 2023-10-28 DIAGNOSIS — Z853 Personal history of malignant neoplasm of breast: Secondary | ICD-10-CM | POA: Insufficient documentation

## 2023-10-28 DIAGNOSIS — Z79899 Other long term (current) drug therapy: Secondary | ICD-10-CM | POA: Diagnosis not present

## 2023-10-28 DIAGNOSIS — I10 Essential (primary) hypertension: Secondary | ICD-10-CM | POA: Insufficient documentation

## 2023-10-28 LAB — COMPREHENSIVE METABOLIC PANEL
ALT: 18 U/L (ref 0–44)
AST: 17 U/L (ref 15–41)
Albumin: 2.2 g/dL — ABNORMAL LOW (ref 3.5–5.0)
Alkaline Phosphatase: 65 U/L (ref 38–126)
Anion gap: 8 (ref 5–15)
BUN: 11 mg/dL (ref 6–20)
CO2: 24 mmol/L (ref 22–32)
Calcium: 7.7 mg/dL — ABNORMAL LOW (ref 8.9–10.3)
Chloride: 104 mmol/L (ref 98–111)
Creatinine, Ser: 0.95 mg/dL (ref 0.44–1.00)
GFR, Estimated: >60 mL/min (ref >60)
Glucose, Bld: 125 mg/dL — ABNORMAL HIGH (ref 70–99)
Potassium: 3.7 mmol/L (ref 3.5–5.1)
Sodium: 136 mmol/L (ref 135–145)
Total Bilirubin: 0.2 mg/dL (ref <1.2)
Total Protein: 5.2 g/dL — ABNORMAL LOW (ref 6.5–8.1)

## 2023-10-28 LAB — CBC WITH DIFFERENTIAL/PLATELET
Eosinophils Absolute: 0.3 10*3/uL (ref 0.0–0.5)
Eosinophils Relative: 3 %
HCT: 17.9 % — ABNORMAL LOW (ref 36.0–46.0)
Hemoglobin: 5.4 g/dL — CL (ref 12.0–15.0)
Lymphocytes Relative: 27 %
Lymphs Abs: 3.3 10*3/uL (ref 0.7–4.0)
MCH: 28.7 pg (ref 26.0–34.0)
MCHC: 30.2 g/dL (ref 30.0–36.0)
MCV: 95.2 fL (ref 80.0–100.0)
Monocytes Absolute: 0.8 10*3/uL (ref 0.1–1.0)
Monocytes Relative: 7 %
Neutro Abs: 7.7 10*3/uL (ref 1.7–7.7)
Neutrophils Relative %: 63 %
Platelets: 383 10*3/uL (ref 150–400)
RBC: 1.88 MIL/uL — ABNORMAL LOW (ref 3.87–5.11)
RDW: 13.8 % (ref 11.5–15.5)
WBC: 12.2 10*3/uL — ABNORMAL HIGH (ref 4.0–10.5)
nRBC: 0 % (ref 0.0–0.2)

## 2023-10-28 LAB — I-STAT CHEM 8, ED
BUN: 11 mg/dL (ref 6–20)
Calcium, Ion: 0.96 mmol/L — ABNORMAL LOW (ref 1.15–1.40)
Chloride: 103 mmol/L (ref 98–111)
Creatinine, Ser: 0.9 mg/dL (ref 0.44–1.00)
Glucose, Bld: 120 mg/dL — ABNORMAL HIGH (ref 70–99)
HCT: 30 % — ABNORMAL LOW (ref 36.0–46.0)
Hemoglobin: 10.2 g/dL — ABNORMAL LOW (ref 12.0–15.0)
Potassium: 3.5 mmol/L (ref 3.5–5.1)
Sodium: 137 mmol/L (ref 135–145)
TCO2: 23 mmol/L (ref 22–32)

## 2023-10-28 LAB — PROTIME-INR
INR: 1.1 (ref 0.8–1.2)
Prothrombin Time: 14.6 s (ref 11.4–15.2)

## 2023-10-28 LAB — ABO/RH: ABO/RH(D): A POS

## 2023-10-28 LAB — PREPARE RBC (CROSSMATCH)

## 2023-10-28 MED ORDER — IOHEXOL 350 MG/ML SOLN
75.0000 mL | Freq: Once | INTRAVENOUS | Status: AC | PRN
  Administered 2023-10-28: 75 mL via INTRAVENOUS

## 2023-10-28 MED ORDER — FENTANYL CITRATE PF 50 MCG/ML IJ SOSY
50.0000 ug | PREFILLED_SYRINGE | Freq: Once | INTRAMUSCULAR | Status: AC
  Administered 2023-10-28: 50 ug via INTRAVENOUS
  Filled 2023-10-28: qty 1

## 2023-10-28 MED ORDER — ONDANSETRON HCL 4 MG/2ML IJ SOLN
INTRAMUSCULAR | Status: AC
  Administered 2023-10-28: 4 mg via INTRAVENOUS
  Filled 2023-10-28: qty 2

## 2023-10-28 MED ORDER — SODIUM CHLORIDE 0.9% IV SOLUTION: Freq: Once | INTRAVENOUS | Status: AC

## 2023-10-28 MED ORDER — MORPHINE SULFATE (PF) 4 MG/ML IV SOLN
4.0000 mg | Freq: Once | INTRAVENOUS | Status: AC
  Administered 2023-10-28: 4 mg via INTRAVENOUS
  Filled 2023-10-28: qty 1

## 2023-10-28 MED ORDER — ONDANSETRON HCL 4 MG/2ML IJ SOLN: 4.0000 mg | Freq: Once | INTRAMUSCULAR | Status: AC

## 2023-10-28 NOTE — ED Provider Notes (Signed)
  [place] [facility] AT [facility] Provider Note   CSN: [id number] Arrival date & time: 10/28/23  2033     History {Add pertinent medical, surgical, social history, OB history to HPI:1} No chief complaint on file.   Pamela Medina is a 57 y.o. female.  The history is provided by the patient and medical records. No language interpreter was used.  Chest Pain      Home Medications Prior to Admission medications   Medication Sig Start Date End Date Taking? Authorizing Provider  amLODipine (NORVASC) 5 MG tablet Take 1 tablet (5 mg total) by mouth daily. 08/12/23   Philip Aspen, Limmie Patricia, MD  levothyroxine (SYNTHROID) 50 MCG tablet Take 1 tablet (50 mcg total) by mouth daily. 02/17/23   Shamleffer, Konrad Dolores, MD      Allergies    Ciprofloxacin, Other, and Penicillins    Review of Systems   Review of Systems  Cardiovascular:  Positive for chest pain.    Physical Exam Updated Vital Signs BP 129/60   Resp 17   Ht 5\' 11"  (1.803 m)   Wt (!) 142.9 kg   BMI 43.93 kg/m  Physical Exam     ED Results / Procedures / Treatments   Labs (all labs ordered are listed, but only abnormal results are displayed) Labs Reviewed - No data to display  EKG None  Radiology No results found.  Procedures Procedures  {Document cardiac monitor, telemetry assessment procedure when appropriate:1}  Medications Ordered in ED Medications - No data to display  ED Course/ Medical Decision Making/ A&P   {   Click here for ABCD2, HEART and other calculatorsREFRESH Note before signing :1}                              Medical Decision Making Amount and/or Complexity of Data Reviewed Labs: ordered. Radiology: ordered.  Risk Prescription drug management.    Pamela Medina is a 57 y.o. female with past medical history significant for          {Document critical care time when appropriate:1} {Document review of labs and clinical decision  tools ie heart score, Chads2Vasc2 etc:1}  {Document your independent review of radiology images, and any outside records:1} {Document your discussion with family members, caretakers, and with consultants:1} {Document social determinants of health affecting pt's care:1} {Document your decision making why or why not admission, treatments were needed:1} Final Clinical Impression(s) / ED Diagnoses Final diagnoses:  None    Rx / DC Orders ED Discharge Orders     None

## 2023-10-28 NOTE — ED Triage Notes (Signed)
Pt BIB EMS from home.Double masectomy on 12/17 Reports pt was reaching for object when she felt pain and jp drain filled with blood. R breast significant increase in size roughly 1930 EMS reports pt became pale diaphoretic and near syncopal episoide during assessment.   EMS VS initial bp palpated 60, recent 100/50, HR 80, cbg 129, 97% RA EMS admin 600 ml fluid

## 2023-10-28 NOTE — ED Notes (Signed)
Call report to [phone] (ED charge nurse) when transport is available. Requested CT push images for viewing to [facility]. [facility] or [facility] do not have transport availability at this time, pt is on list for transport. Dr. Tegeler/Kommor aware.

## 2023-10-28 NOTE — ED Notes (Signed)
Pt reports last drinking water around 1930 and last meal at 1230.
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
## 2023-10-29 DIAGNOSIS — S299XXA Unspecified injury of thorax, initial encounter: Secondary | ICD-10-CM | POA: Diagnosis present

## 2023-10-29 DIAGNOSIS — E039 Hypothyroidism, unspecified: Secondary | ICD-10-CM | POA: Diagnosis not present

## 2023-10-29 DIAGNOSIS — X58XXXA Exposure to other specified factors, initial encounter: Secondary | ICD-10-CM | POA: Diagnosis not present

## 2023-10-29 DIAGNOSIS — Z853 Personal history of malignant neoplasm of breast: Secondary | ICD-10-CM | POA: Diagnosis not present

## 2023-10-29 DIAGNOSIS — I959 Hypotension, unspecified: Secondary | ICD-10-CM | POA: Diagnosis not present

## 2023-10-29 DIAGNOSIS — D649 Anemia, unspecified: Secondary | ICD-10-CM | POA: Diagnosis not present

## 2023-10-29 DIAGNOSIS — I10 Essential (primary) hypertension: Secondary | ICD-10-CM | POA: Diagnosis not present

## 2023-10-29 DIAGNOSIS — Z79899 Other long term (current) drug therapy: Secondary | ICD-10-CM | POA: Diagnosis not present

## 2023-10-29 DIAGNOSIS — S20211A Contusion of right front wall of thorax, initial encounter: Secondary | ICD-10-CM | POA: Diagnosis not present

## 2023-10-29 LAB — TYPE AND SCREEN
ABO/RH(D): A POS
Antibody Screen: NEGATIVE
Unit division: 0
Unit division: 0

## 2023-10-29 LAB — BPAM RBC
Blood Product Expiration Date: 202501012359
Blood Product Expiration Date: 202501072359
ISSUE DATE / TIME: 202412252155
ISSUE DATE / TIME: 202412252155
Unit Type and Rh: 600
Unit Type and Rh: 6200

## 2023-10-29 NOTE — ED Notes (Signed)
RBCs transfusing with transport [facility]

## 2023-10-29 NOTE — ED Notes (Signed)
Report called to charge RN Baxter Hire at [facility] ED. Report given to [facility].

## 2023-10-29 NOTE — ED Provider Notes (Signed)
  Physical Exam  BP 107/75   Pulse 87   Temp 98.4 F (36.9 C)   Resp 18   Ht 5\' 11"  (1.803 m)   Wt (!) 142.9 kg   SpO2 100%   BMI 43.93 kg/m   Physical Exam Vitals and nursing note reviewed.  Constitutional:      General: She is not in acute distress.    Appearance: She is well-developed.  HENT:     Head: Normocephalic and atraumatic.  Eyes:     Conjunctiva/sclera: Conjunctivae normal.  Cardiovascular:     Rate and Rhythm: Normal rate and regular rhythm.     Heart sounds: No murmur heard. Pulmonary:     Effort: Pulmonary effort is normal. No respiratory distress.     Breath sounds: Normal breath sounds.  Abdominal:     Palpations: Abdomen is soft.     Tenderness: There is no abdominal tenderness.  Musculoskeletal:        General: No swelling.     Cervical back: Neck supple.     Comments: Swelling of the right breast  Skin:    General: Skin is warm and dry.     Capillary Refill: Capillary refill takes less than 2 seconds.  Neurological:     Mental Status: She is alert.  Psychiatric:        Mood and Affect: Mood normal.     Procedures  Procedures  ED Course / MDM    Medical Decision Making Amount and/or Complexity of Data Reviewed Labs: ordered. Radiology: ordered.  Risk Prescription drug management.   Patient received in handoff.  Recent mastectomy with CT imaging with large postoperative hematoma but no active extravasation.  Patient was very hypotensive in the field and with new significant anemia requiring multiple blood transfusions here in the ER.  At time of signout, both [facility] and [facility] were unsure if they were going to be able to transport the patient until the morning given lack of available resources.  I did have concern for possible decompensation and called both transfer centers at [facility] and [facility] to establish an emergency plan in case patient's clinical status was to worsen.  [facility] was unable to provide any additional resources.  However, [facility]  graciously prioritized the patient and facilitated transfer to [facility].      Glendora Score, MD 10/29/23 [phone]

## 2023-10-29 NOTE — ED Provider Notes (Incomplete)
  [place] EMERGENCY DEPARTMENT AT [facility] Provider Note   CSN: [id number] Arrival date & time: 10/28/23  2033     History {Add pertinent medical, surgical, social history, OB history to HPI:1} No chief complaint on file.   Pamela Medina is a 57 y.o. female.  The history is provided by the patient and medical records. No language interpreter was used.  Chest Pain Pain location:  R chest and R lateral chest Pain quality: aching and pressure        Home Medications Prior to Admission medications   Medication Sig Start Date End Date Taking? Authorizing Provider  amLODipine (NORVASC) 5 MG tablet Take 1 tablet (5 mg total) by mouth daily. 08/12/23   Philip Aspen, Limmie Patricia, MD  levothyroxine (SYNTHROID) 50 MCG tablet Take 1 tablet (50 mcg total) by mouth daily. 02/17/23   Shamleffer, Konrad Dolores, MD      Allergies    Ciprofloxacin, Other, and Penicillins    Review of Systems   Review of Systems  Cardiovascular:  Positive for chest pain.    Physical Exam Updated Vital Signs BP 129/60   Resp 17   Ht 5\' 11"  (1.803 m)   Wt (!) 142.9 kg   BMI 43.93 kg/m  Physical Exam     ED Results / Procedures / Treatments   Labs (all labs ordered are listed, but only abnormal results are displayed) Labs Reviewed - No data to display  EKG None  Radiology No results found.  Procedures Procedures  {Document cardiac monitor, telemetry assessment procedure when appropriate:1}  Medications Ordered in ED Medications - No data to display  ED Course/ Medical Decision Making/ A&P   {   Click here for ABCD2, HEART and other calculatorsREFRESH Note before signing :1}                              Medical Decision Making Amount and/or Complexity of Data Reviewed Labs: ordered. Radiology: ordered.  Risk Prescription drug management.    Pamela Medina is a 57 y.o. female with past medical history significant for          {Document critical  care time when appropriate:1} {Document review of labs and clinical decision tools ie heart score, Chads2Vasc2 etc:1}  {Document your independent review of radiology images, and any outside records:1} {Document your discussion with family members, caretakers, and with consultants:1} {Document social determinants of health affecting pt's care:1} {Document your decision making why or why not admission, treatments were needed:1} Final Clinical Impression(s) / ED Diagnoses Final diagnoses:  None    Rx / DC Orders ED Discharge Orders     None

## 2023-11-04 DIAGNOSIS — I82409 Acute embolism and thrombosis of unspecified deep veins of unspecified lower extremity: Secondary | ICD-10-CM

## 2023-11-04 DIAGNOSIS — I2699 Other pulmonary embolism without acute cor pulmonale: Secondary | ICD-10-CM

## 2023-11-04 HISTORY — DX: Acute embolism and thrombosis of unspecified deep veins of unspecified lower extremity: I82.409

## 2023-11-04 HISTORY — DX: Other pulmonary embolism without acute cor pulmonale: I26.99

## 2023-11-12 ENCOUNTER — Encounter: Payer: Self-pay | Admitting: Internal Medicine

## 2023-11-12 ENCOUNTER — Ambulatory Visit: Payer: Commercial Managed Care - PPO | Admitting: Internal Medicine

## 2023-11-12 ENCOUNTER — Ambulatory Visit: Payer: Commercial Managed Care - PPO | Admitting: Podiatry

## 2023-11-12 ENCOUNTER — Ambulatory Visit (INDEPENDENT_AMBULATORY_CARE_PROVIDER_SITE_OTHER): Payer: Commercial Managed Care - PPO | Admitting: Internal Medicine

## 2023-11-12 VITALS — BP 128/80 | Temp 98.1°F | Wt 320.5 lb

## 2023-11-12 DIAGNOSIS — Z6841 Body Mass Index (BMI) 40.0 and over, adult: Secondary | ICD-10-CM | POA: Diagnosis not present

## 2023-11-12 DIAGNOSIS — E559 Vitamin D deficiency, unspecified: Secondary | ICD-10-CM | POA: Diagnosis not present

## 2023-11-12 DIAGNOSIS — B351 Tinea unguium: Secondary | ICD-10-CM | POA: Diagnosis not present

## 2023-11-12 DIAGNOSIS — I10 Essential (primary) hypertension: Secondary | ICD-10-CM | POA: Diagnosis not present

## 2023-11-12 DIAGNOSIS — Z09 Encounter for follow-up examination after completed treatment for conditions other than malignant neoplasm: Secondary | ICD-10-CM

## 2023-11-12 DIAGNOSIS — D62 Acute posthemorrhagic anemia: Secondary | ICD-10-CM | POA: Diagnosis not present

## 2023-11-12 DIAGNOSIS — N6489 Other specified disorders of breast: Secondary | ICD-10-CM

## 2023-11-12 LAB — CBC WITH DIFFERENTIAL/PLATELET
Basophils Absolute: 0 10*3/uL (ref 0.0–0.1)
Basophils Relative: 0.1 % (ref 0.0–3.0)
Eosinophils Absolute: 0.3 10*3/uL (ref 0.0–0.7)
Eosinophils Relative: 3.9 % (ref 0.0–5.0)
HCT: 31.7 % — ABNORMAL LOW (ref 36.0–46.0)
Hemoglobin: 10.1 g/dL — ABNORMAL LOW (ref 12.0–15.0)
Lymphocytes Relative: 32.3 % (ref 12.0–46.0)
Lymphs Abs: 2.6 10*3/uL (ref 0.7–4.0)
MCHC: 31.8 g/dL (ref 30.0–36.0)
MCV: 89.3 fL (ref 78.0–100.0)
Monocytes Absolute: 0.5 10*3/uL (ref 0.1–1.0)
Monocytes Relative: 6.2 % (ref 3.0–12.0)
Neutro Abs: 4.6 10*3/uL (ref 1.4–7.7)
Neutrophils Relative %: 57.5 % (ref 43.0–77.0)
Platelets: 398 10*3/uL (ref 150.0–400.0)
RBC: 3.55 Mil/uL — ABNORMAL LOW (ref 3.87–5.11)
RDW: 14.7 % (ref 11.5–15.5)
WBC: 7.9 10*3/uL (ref 4.0–10.5)

## 2023-11-12 LAB — IBC + FERRITIN
Ferritin: 113.9 ng/mL (ref 10.0–291.0)
Iron: 57 ug/dL (ref 42–145)
Saturation Ratios: 20.5 % (ref 20.0–50.0)
TIBC: 278.6 ug/dL (ref 250.0–450.0)
Transferrin: 199 mg/dL — ABNORMAL LOW (ref 212.0–360.0)

## 2023-11-12 LAB — HEMOGLOBIN A1C: Hgb A1c MFr Bld: 5 % (ref 4.6–6.5)

## 2023-11-12 LAB — VITAMIN B12: Vitamin B-12: 438 pg/mL (ref 211–911)

## 2023-11-12 LAB — LIPID PANEL
Cholesterol: 203 mg/dL — ABNORMAL HIGH (ref 0–200)
HDL: 50.3 mg/dL (ref >39.00)
LDL Cholesterol: 134 mg/dL — ABNORMAL HIGH (ref 0–99)
NonHDL: 152.75
Total CHOL/HDL Ratio: 4
Triglycerides: 93 mg/dL (ref 0.0–149.0)
VLDL: 18.6 mg/dL (ref 0.0–40.0)

## 2023-11-12 LAB — TSH: TSH: 3.76 u[IU]/mL (ref 0.35–5.50)

## 2023-11-12 LAB — VITAMIN D 25 HYDROXY (VIT D DEFICIENCY, FRACTURES): VITD: 15.42 ng/mL — ABNORMAL LOW (ref 30.00–100.00)

## 2023-11-12 MED ORDER — EFINACONAZOLE 10 % EX SOLN: 1.0000 [drp] | Freq: Every day | CUTANEOUS | 11 refills | Status: AC

## 2023-11-12 MED ORDER — TERBINAFINE HCL 250 MG PO TABS: 250.0000 mg | ORAL_TABLET | Freq: Every day | ORAL | 0 refills | Status: AC

## 2023-11-12 NOTE — Progress Notes (Signed)
  Subjective:  Patient ID: Pamela Medina, female    DOB: July 22, 1966,  MRN: [id number]  Chief Complaint  Patient presents with   Nail Problem    She is having some pain in the right big toe and feels like it may not be attached well after growing.     Discussed the use of AI scribe software for clinical note transcription with the patient, who gave verbal consent to proceed.  History of Present Illness   The patient presents with a longstanding issue of a discolored, thickened toenail, suspected to be a fungal infection. She reports no associated pain, except for some discomfort on the side of the affected toenail. The patient denies any history of trauma or injury to the toenail. She has attempted self-care with pedicures and leaving the toenail alone for a while, but the condition has persisted.  The patient is currently on gabapentin  and other medications following a recent bilateral mastectomy. She has no history of liver or kidney problems. She is also taking an antibiotic, doxycycline, for an unrelated issue.          Objective:    Physical Exam   SKIN: Bilateral feet warm and low profuse. Good papillary pulses. Thickening and discoloration of the bilateral great toes, worst affected is the right toe with onycholysis.           Results   Procedure: Toenail Trimming Description: Trimmed loose areas of the toenail. Debrided fungal-infected portions. Documented with a photograph for the chart.      Assessment:   1. Onychomycosis      Plan:  Patient was evaluated and treated and all questions answered.  Assessment and Plan    Onychomycosis   She has a fungal infection of the bilateral great toenails, with the right side being more affected. She experiences no pain or discomfort and has no history of liver or kidney problems. She is currently taking gabapentin  and doxycycline for unrelated conditions. We will trim back the affected areas of the toenails and start  Lamisil  orally once daily for three months, while monitoring for potential GI side effects. Jublia  will be applied topically to both big toenails daily until the follow-up visit. She is advised to use a separate nail clipper for affected toenails to prevent the spread of infection. A follow-up visit is scheduled in four months to assess progress.          No follow-ups on file.
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
## 2023-11-13 ENCOUNTER — Other Ambulatory Visit: Payer: Self-pay | Admitting: Internal Medicine

## 2023-11-13 ENCOUNTER — Encounter: Payer: Self-pay | Admitting: Internal Medicine

## 2023-11-13 DIAGNOSIS — I10 Essential (primary) hypertension: Secondary | ICD-10-CM

## 2023-11-16 MED ORDER — AMLODIPINE BESYLATE 5 MG PO TABS: 5.0000 mg | ORAL_TABLET | Freq: Every day | ORAL | 1 refills | Status: DC

## 2023-11-30 ENCOUNTER — Encounter: Payer: Self-pay | Admitting: Family Medicine

## 2023-11-30 ENCOUNTER — Ambulatory Visit (INDEPENDENT_AMBULATORY_CARE_PROVIDER_SITE_OTHER): Payer: Commercial Managed Care - PPO | Admitting: Internal Medicine

## 2023-11-30 ENCOUNTER — Encounter: Payer: Self-pay | Admitting: Internal Medicine

## 2023-11-30 ENCOUNTER — Other Ambulatory Visit: Payer: Self-pay

## 2023-11-30 ENCOUNTER — Ambulatory Visit: Payer: Commercial Managed Care - PPO | Admitting: Family Medicine

## 2023-11-30 VITALS — BP 130/84 | HR 78 | Ht 70.0 in | Wt 314.0 lb

## 2023-11-30 VITALS — BP 130/84 | Temp 98.2°F | Wt 317.4 lb

## 2023-11-30 DIAGNOSIS — Z09 Encounter for follow-up examination after completed treatment for conditions other than malignant neoplasm: Secondary | ICD-10-CM

## 2023-11-30 DIAGNOSIS — N61 Mastitis without abscess: Secondary | ICD-10-CM | POA: Diagnosis not present

## 2023-11-30 DIAGNOSIS — I2699 Other pulmonary embolism without acute cor pulmonale: Secondary | ICD-10-CM

## 2023-11-30 DIAGNOSIS — G8929 Other chronic pain: Secondary | ICD-10-CM | POA: Diagnosis not present

## 2023-11-30 DIAGNOSIS — I82409 Acute embolism and thrombosis of unspecified deep veins of unspecified lower extremity: Secondary | ICD-10-CM

## 2023-11-30 DIAGNOSIS — M25562 Pain in left knee: Secondary | ICD-10-CM | POA: Diagnosis not present

## 2023-11-30 DIAGNOSIS — M170 Bilateral primary osteoarthritis of knee: Secondary | ICD-10-CM | POA: Diagnosis not present

## 2023-11-30 DIAGNOSIS — M25561 Pain in right knee: Secondary | ICD-10-CM

## 2023-11-30 MED ORDER — APIXABAN 5 MG PO TABS: 5.0000 mg | ORAL_TABLET | Freq: Two times a day (BID) | ORAL | Status: DC

## 2023-11-30 NOTE — Progress Notes (Signed)
I, Stevenson Clinch, CMA acting as a scribe for [name], MD.  Pamela Medina is a 58 y.o. female who presents to [facility] at [facility] today for knee pain. Pt was last seen by Dr. Denyse Amass on 07/17/23 and was given bilat knee steroid injections and was advised to cont working on HEP focusing on quad strengthening.  Today, pt reports bilateral knee pain, L>R. Notes worsening sx x 1.5 months. Swelling present bilaterally. Catching in the left knee. Denies new injury. Has tried Tylenol, Advil, Aleve. Has also tried using heat/ice. Has been able to rest d/t recent bilat mastectomy.   Additionally she had a DVT in her right lower leg and is currently on Eliquis.  Dx imaging:  07/05/20 R & L knee XR   Pertinent review of systems: No fevers or chills  Relevant historical information: Breast cancer with mastectomy complicated by bleeding and subsequently a DVT now on Eliquis.   Exam:  BP 130/84   Pulse 78   Ht 5\' 10"  (1.778 m)   Wt (!) 314 lb (142.4 kg)   SpO2 100%   BMI 45.05 kg/m  General: Well Developed, well nourished, and in no acute distress.   MSK: Knees bilaterally minimal effusion normal motion with crepitation.    Lab and Radiology Results  Procedure: Real-time Ultrasound Guided Injection of right knee joint superior lateral patella space Device: Philips Affiniti 50G/GE Logiq Images permanently stored and available for review in PACS Verbal informed consent obtained.  Discussed risks and benefits of procedure. Warned about infection, bleeding, hyperglycemia damage to structures among others. Patient expresses understanding and agreement Time-out conducted.   Noted no overlying erythema, induration, or other signs of local infection.   Skin prepped in a sterile fashion.   Local anesthesia: Topical Ethyl chloride.   With sterile technique and under real time ultrasound guidance: 40 mg of Kenalog and 2 mL of Marcaine injected into knee joint. Fluid  seen entering the joint capsule.   Completed without difficulty   Pain immediately resolved suggesting accurate placement of the medication.   Advised to call if fevers/chills, erythema, induration, drainage, or persistent bleeding.   Images permanently stored and available for review in the ultrasound unit.  Impression: Technically successful ultrasound guided injection.   Procedure: Real-time Ultrasound Guided Injection of left knee joint superior lateral patella space Device: Philips Affiniti 50G/GE Logiq Images permanently stored and available for review in PACS Verbal informed consent obtained.  Discussed risks and benefits of procedure. Warned about infection, bleeding, hyperglycemia damage to structures among others. Patient expresses understanding and agreement Time-out conducted.   Noted no overlying erythema, induration, or other signs of local infection.   Skin prepped in a sterile fashion.   Local anesthesia: Topical Ethyl chloride.   With sterile technique and under real time ultrasound guidance: 40 mg of Kenalog and 2 mL of Marcaine injected into knee joint. Fluid seen entering the joint capsule.   Completed without difficulty   Pain immediately resolved suggesting accurate placement of the medication.   Advised to call if fevers/chills, erythema, induration, drainage, or persistent bleeding.   Images permanently stored and available for review in the ultrasound unit.  Impression: Technically successful ultrasound guided injection.        Assessment and Plan: 58 y.o. 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  Previous steroid injection lasted about 2 months.  Repeat injection today.  Will work on authorization for hyaluronic acid injections and Zilretta so we have  some next steps potentially ready to go for when her pain returns.    PDMP not reviewed this encounter. Orders Placed This Encounter  Procedures   [place] LIMITED JOINT SPACE STRUCTURES LOW BILAT(NO  LINKED CHARGES)    Reason for Exam (SYMPTOM  OR DIAGNOSIS REQUIRED):   bilat knee pain    Preferred imaging location?:   [facility]   No orders of the defined types were placed in this encounter.    Discussed warning signs or symptoms. Please see discharge instructions. Patient expresses understanding.   The above documentation has been reviewed and is accurate and complete [name], M.D.
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
## 2023-11-30 NOTE — Patient Instructions (Addendum)
[facility] [phone] [address] [place], [place] [address]  Thank you for coming in today.  You received an injection today. Seek immediate medical attention if the joint becomes red, extremely painful, or is oozing fluid.  We will check benefits for Gel Shots and Zilretta for your knees.

## 2023-12-02 ENCOUNTER — Encounter: Payer: Self-pay | Admitting: Internal Medicine

## 2023-12-03 ENCOUNTER — Telehealth: Payer: Self-pay

## 2023-12-03 NOTE — Telephone Encounter (Signed)
Can you schedule when the medication is stocked please    Zilretta authorized for Bilateral knee  No prior authorization, medical notes or referrals needed.   Plan follows UMR guidelines. Z6109 Arletta Bale), CPT code 60454 and specialist office visits are covered at 100% by the payer at the contracted rate with no patient responsibility.   Deductible and out of pocket have been met, coverage is now at 100% of the allowable amount.   Document scanned

## 2023-12-03 NOTE — Telephone Encounter (Signed)
Holding for stock to arrive.

## 2023-12-07 ENCOUNTER — Ambulatory Visit: Payer: Commercial Managed Care - PPO | Admitting: Podiatry

## 2023-12-09 ENCOUNTER — Encounter: Payer: Self-pay | Admitting: Internal Medicine

## 2023-12-09 ENCOUNTER — Other Ambulatory Visit: Payer: Self-pay | Admitting: Internal Medicine

## 2023-12-09 DIAGNOSIS — E559 Vitamin D deficiency, unspecified: Secondary | ICD-10-CM

## 2023-12-09 MED ORDER — VITAMIN D (ERGOCALCIFEROL) 1.25 MG (50000 UNIT) PO CAPS: 50000.0000 [IU] | ORAL_CAPSULE | ORAL | 0 refills | Status: AC

## 2023-12-10 NOTE — Telephone Encounter (Signed)
 Patient does not wish to schedule at this time. Her knees are feeling better and she will call back when needed.

## 2023-12-10 NOTE — Telephone Encounter (Signed)
**   Please allow time to order. **

## 2023-12-28 ENCOUNTER — Ambulatory Visit
Admission: EM | Admit: 2023-12-28 | Discharge: 2023-12-28 | Disposition: A | Discharge status: Home / Self Care | Payer: Commercial Managed Care - PPO | Attending: Family Medicine | Admitting: Family Medicine

## 2023-12-28 DIAGNOSIS — I2782 Chronic pulmonary embolism: Secondary | ICD-10-CM | POA: Diagnosis not present

## 2023-12-28 DIAGNOSIS — R0789 Other chest pain: Secondary | ICD-10-CM | POA: Diagnosis not present

## 2023-12-28 MED ORDER — ACETAMINOPHEN 325 MG PO TABS: 650.0000 mg | ORAL_TABLET | Freq: Four times a day (QID) | ORAL | 0 refills | Status: DC | PRN

## 2023-12-28 MED ORDER — CYCLOBENZAPRINE HCL 5 MG PO TABS: 5.0000 mg | ORAL_TABLET | Freq: Every evening | ORAL | 0 refills | Status: DC | PRN
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
## 2023-12-28 NOTE — Discharge Instructions (Signed)
 Continue taking Eliquis as prescribed. If your chest pain, shortness of breath persist then go straight to the emergency room. This is especially important if your heart rate starts to spike above 100 beats a minute and lingers there. Otherwise, use Tylenol and cyclobenzaprine for suspected musculoskeletal chest pain.

## 2023-12-28 NOTE — ED Triage Notes (Signed)
 Pt c/o left side CP, SHOB x 2-3 days-denies flu/fever sx-states pain is worse with a deep breath started last night-NAD-steady gait

## 2024-01-05 ENCOUNTER — Encounter: Payer: Self-pay | Admitting: Internal Medicine

## 2024-01-19 ENCOUNTER — Encounter: Payer: Self-pay | Admitting: Internal Medicine

## 2024-01-19 DIAGNOSIS — Z9010 Acquired absence of unspecified breast and nipple: Secondary | ICD-10-CM

## 2024-02-10 NOTE — Progress Notes (Unsigned)
 I, Rolland Bimler am a scribe for [employer].    Pamela Medina is a 58 y.o. female who presents to [facility] at [facility] today for exacerbation of her bilat knee pain. Pt was last seen by Dr. Denyse Amass on 11/30/23 and was given bilat knee steroid injections and we worked to [employer].  Today, pt reports that she is feeling about today. The stiffness has kicked in now.The pain is normal now as well. IT is an all day thing. Limits daily activities. Happened in the last couple of weeks. Doing the exercises and stretching to try to help the stiffness. Medication wise she can only take tylenol right now and that doesn't do anything at all.   Dx imaging: 07/17/23 R & L knee XR 07/05/20 R & L knee XR   Pertinent review of systems: No fevers or chills  Relevant historical information: Hypertension   Exam:  BP 118/80   Pulse 65   Ht 5\' 10"  (1.778 m)   Wt (!) 307 lb 12.8 oz (139.6 kg)   SpO2 100%   BMI 44.16 kg/m  General: Well Developed, well nourished, and in no acute distress.   MSK: Mild knee effusion.  Normal knee motion.    Lab and Radiology Results  Procedure: Real-time Ultrasound Guided Injection of right knee joint superior lateral patella space Device: Philips Affiniti 50G/GE Logiq Images permanently stored and available for review in PACS Verbal informed consent obtained.  Discussed risks and benefits of procedure. Warned about infection, bleeding, hyperglycemia damage to structures among others. Patient expresses understanding and agreement Time-out conducted.   Noted no overlying erythema, induration, or other signs of local infection.   Skin prepped in a sterile fashion.   Local anesthesia: Topical Ethyl chloride.   With sterile technique and under real time ultrasound guidance: 40 mg of Kenalog and 2 mL of Marcaine injected into knee joint. Fluid seen entering the joint capsule.   Completed without difficulty   Pain immediately resolved  suggesting accurate placement of the medication.   Advised to call if fevers/chills, erythema, induration, drainage, or persistent bleeding.   Images permanently stored and available for review in the ultrasound unit.  Impression: Technically successful ultrasound guided injection.   Procedure: Real-time Ultrasound Guided Injection of left knee joint superior lateral patella space Device: Philips Affiniti 50G/GE Logiq Images permanently stored and available for review in PACS Verbal informed consent obtained.  Discussed risks and benefits of procedure. Warned about infection, bleeding, hyperglycemia damage to structures among others. Patient expresses understanding and agreement Time-out conducted.   Noted no overlying erythema, induration, or other signs of local infection.   Skin prepped in a sterile fashion.   Local anesthesia: Topical Ethyl chloride.   With sterile technique and under real time ultrasound guidance: 40 mg of Kenalog and 2 mL of Marcaine injected into knee joint. Fluid seen entering the joint capsule.   Completed without difficulty   Pain immediately resolved suggesting accurate placement of the medication.   Advised to call if fevers/chills, erythema, induration, drainage, or persistent bleeding.   Images permanently stored and available for review in the ultrasound unit.  Impression: Technically successful ultrasound guided injection.       Assessment and Plan: 58 y.o. 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  Patient had about a months relief with conventional steroid injection performed about 3 months ago.  I intended today to provide Zilretta injections which have been authorized.  However due to a  communication error I injected conventional steroid injection into her right knee.  We discussed the options from here.  We decided to perform a conventional steroid injection in the left knee as well and when the pain comes back proceed to Zilretta injections  in approximately a month or 2.  She is understanding and I apologized.  This is safe but not ideal.  I do not intend to charge for today's services as I made an error.   PDMP not reviewed this encounter. Orders Placed This Encounter  Procedures   [place] LIMITED JOINT SPACE STRUCTURES LOW BILAT(NO LINKED CHARGES)    Reason for Exam (SYMPTOM  OR DIAGNOSIS REQUIRED):   knee pain    Preferred imaging location?:   [facility]   No orders of the defined types were placed in this encounter.    Discussed warning signs or symptoms. Please see discharge instructions. Patient expresses understanding.   The above documentation has been reviewed and is accurate and complete [name], M.D.

## 2024-02-12 ENCOUNTER — Other Ambulatory Visit: Payer: Self-pay

## 2024-02-12 ENCOUNTER — Ambulatory Visit: Admitting: Family Medicine

## 2024-02-12 ENCOUNTER — Encounter: Payer: Self-pay | Admitting: Family Medicine

## 2024-02-12 VITALS — BP 118/80 | HR 65 | Ht 70.0 in | Wt 307.8 lb

## 2024-02-12 DIAGNOSIS — M25561 Pain in right knee: Secondary | ICD-10-CM

## 2024-02-12 DIAGNOSIS — G8929 Other chronic pain: Secondary | ICD-10-CM | POA: Diagnosis not present

## 2024-02-12 DIAGNOSIS — M170 Bilateral primary osteoarthritis of knee: Secondary | ICD-10-CM

## 2024-02-12 NOTE — Patient Instructions (Signed)
 Thank you for coming in today. Injected bilateral knees today.

## 2024-02-15 ENCOUNTER — Institutional Professional Consult (permissible substitution): Admitting: Plastic Surgery

## 2024-02-15 ENCOUNTER — Ambulatory Visit: Payer: Commercial Managed Care - PPO | Admitting: Internal Medicine
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
## 2024-02-19 ENCOUNTER — Other Ambulatory Visit: Payer: Self-pay | Admitting: Internal Medicine

## 2024-02-19 DIAGNOSIS — I10 Essential (primary) hypertension: Secondary | ICD-10-CM

## 2024-02-23 ENCOUNTER — Encounter: Payer: Self-pay | Admitting: Internal Medicine

## 2024-02-26 ENCOUNTER — Other Ambulatory Visit: Payer: Self-pay

## 2024-02-26 ENCOUNTER — Encounter (HOSPITAL_BASED_OUTPATIENT_CLINIC_OR_DEPARTMENT_OTHER): Payer: Self-pay | Admitting: Plastic Surgery

## 2024-03-03 ENCOUNTER — Encounter: Payer: Self-pay | Admitting: Internal Medicine

## 2024-03-03 ENCOUNTER — Ambulatory Visit: Admitting: Internal Medicine

## 2024-03-03 VITALS — BP 124/80 | HR 95 | Temp 98.3°F | Wt 304.2 lb

## 2024-03-03 DIAGNOSIS — K5900 Constipation, unspecified: Secondary | ICD-10-CM

## 2024-03-03 NOTE — Anesthesia Preprocedure Evaluation (Addendum)
 Anesthesia Evaluation  Patient identified by MRN, date of birth, ID band Patient awake    Reviewed: Allergy & Precautions, NPO status , Patient's Chart, lab work & pertinent test results  History of Anesthesia Complications Negative for: history of anesthetic complications  Airway Mallampati: II  TM Distance: >3 FB Neck ROM: Full    Dental  (+) Dental Advisory Given, Teeth Intact   Pulmonary sleep apnea (noncompliant) , PE   Pulmonary exam normal        Cardiovascular hypertension, Pt. on medications + DVT  Normal cardiovascular exam     Neuro/Psych negative neurological ROS  negative psych ROS   GI/Hepatic negative GI ROS, Neg liver ROS,,,  Endo/Other  Hypothyroidism  Class 3 obesity  Renal/GU negative Renal ROS     Musculoskeletal  (+) Arthritis ,    Abdominal   Peds  Hematology negative hematology ROS (+)   Anesthesia Other Findings   Reproductive/Obstetrics                             Anesthesia Physical Anesthesia Plan  ASA: 3  Anesthesia Plan: General   Post-op Pain Management: Tylenol  PO (pre-op)* and Celebrex  PO (pre-op)*   Induction: Intravenous  PONV Risk Score and Plan: 3 and Treatment may vary due to age or medical condition, Ondansetron , Dexamethasone  and Midazolam   Airway Management Planned: Oral ETT  Additional Equipment: None  Intra-op Plan:   Post-operative Plan: Extubation in OR  Informed Consent: I have reviewed the patients History and Physical, chart, labs and discussed the procedure including the risks, benefits and alternatives for the proposed anesthesia with the patient or authorized representative who has indicated his/her understanding and acceptance.     Dental advisory given  Plan Discussed with: CRNA and Anesthesiologist  Anesthesia Plan Comments:         Anesthesia Quick Evaluation
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
## 2024-03-04 ENCOUNTER — Other Ambulatory Visit: Payer: Self-pay

## 2024-03-04 ENCOUNTER — Encounter (HOSPITAL_BASED_OUTPATIENT_CLINIC_OR_DEPARTMENT_OTHER)
Admission: RE | Disposition: A | Discharge status: Home / Self Care | Payer: Self-pay | Source: Home / Self Care | Attending: Plastic Surgery

## 2024-03-04 ENCOUNTER — Ambulatory Visit (HOSPITAL_BASED_OUTPATIENT_CLINIC_OR_DEPARTMENT_OTHER): Payer: Self-pay | Admitting: Anesthesiology

## 2024-03-04 ENCOUNTER — Other Ambulatory Visit: Payer: Self-pay | Admitting: Internal Medicine

## 2024-03-04 ENCOUNTER — Encounter (HOSPITAL_BASED_OUTPATIENT_CLINIC_OR_DEPARTMENT_OTHER): Payer: Self-pay | Admitting: Plastic Surgery

## 2024-03-04 ENCOUNTER — Ambulatory Visit (HOSPITAL_BASED_OUTPATIENT_CLINIC_OR_DEPARTMENT_OTHER)
Admission: RE | Admit: 2024-03-04 | Discharge: 2024-03-04 | Disposition: A | Discharge status: Home / Self Care | Attending: Plastic Surgery | Admitting: Plastic Surgery

## 2024-03-04 DIAGNOSIS — E039 Hypothyroidism, unspecified: Secondary | ICD-10-CM | POA: Diagnosis not present

## 2024-03-04 DIAGNOSIS — I10 Essential (primary) hypertension: Secondary | ICD-10-CM

## 2024-03-04 DIAGNOSIS — Z803 Family history of malignant neoplasm of breast: Secondary | ICD-10-CM | POA: Diagnosis not present

## 2024-03-04 DIAGNOSIS — Z853 Personal history of malignant neoplasm of breast: Secondary | ICD-10-CM

## 2024-03-04 DIAGNOSIS — Z9013 Acquired absence of bilateral breasts and nipples: Secondary | ICD-10-CM | POA: Insufficient documentation

## 2024-03-04 DIAGNOSIS — F840 Autistic disorder: Secondary | ICD-10-CM | POA: Diagnosis not present

## 2024-03-04 DIAGNOSIS — E559 Vitamin D deficiency, unspecified: Secondary | ICD-10-CM

## 2024-03-04 DIAGNOSIS — Z01818 Encounter for other preprocedural examination: Secondary | ICD-10-CM

## 2024-03-04 DIAGNOSIS — Z421 Encounter for breast reconstruction following mastectomy: Secondary | ICD-10-CM | POA: Diagnosis present

## 2024-03-04 HISTORY — PX: ALLOGRAFT APPLICATION: SHX6404

## 2024-03-04 HISTORY — PX: BREAST RECONSTRUCTION: SHX9

## 2024-03-04 HISTORY — DX: Essential (primary) hypertension: I10

## 2024-03-04 HISTORY — DX: Hypothyroidism, unspecified: E03.9

## 2024-03-04 SURGERY — RECONSTRUCTION, BREAST
Anesthesia: General | Site: Chest | Laterality: Bilateral

## 2024-03-04 MED ORDER — CELECOXIB 200 MG PO CAPS: 200.0000 mg | ORAL_CAPSULE | ORAL | Status: AC

## 2024-03-04 MED ORDER — FENTANYL CITRATE (PF) 100 MCG/2ML IJ SOLN
INTRAMUSCULAR | Status: AC
Start: 2024-03-04 — End: ?
  Filled 2024-03-04: qty 2

## 2024-03-04 MED ORDER — FENTANYL CITRATE (PF) 100 MCG/2ML IJ SOLN
INTRAMUSCULAR | Status: DC | PRN
  Administered 2024-03-04: 100 ug via INTRAVENOUS
  Administered 2024-03-04 (×2): 50 ug via INTRAVENOUS

## 2024-03-04 MED ORDER — CELECOXIB 200 MG PO CAPS
200.0000 mg | ORAL_CAPSULE | Freq: Once | ORAL | Status: AC
  Administered 2024-03-04: 200 mg via ORAL

## 2024-03-04 MED ORDER — FENTANYL CITRATE (PF) 100 MCG/2ML IJ SOLN
INTRAMUSCULAR | Status: AC
  Filled 2024-03-04: qty 2

## 2024-03-04 MED ORDER — HEPARIN SODIUM (PORCINE) 5000 UNIT/ML IJ SOLN
INTRAMUSCULAR | Status: AC
  Filled 2024-03-04: qty 1

## 2024-03-04 MED ORDER — SODIUM CHLORIDE 0.9 % IV SOLN
12.5000 mg | INTRAVENOUS | Status: DC | PRN
  Filled 2024-03-04: qty 0.5

## 2024-03-04 MED ORDER — PHENYLEPHRINE 80 MCG/ML (10ML) SYRINGE FOR IV PUSH (FOR BLOOD PRESSURE SUPPORT)
PREFILLED_SYRINGE | INTRAVENOUS | Status: AC
  Filled 2024-03-04: qty 10

## 2024-03-04 MED ORDER — DEXAMETHASONE SODIUM PHOSPHATE 10 MG/ML IJ SOLN
INTRAMUSCULAR | Status: AC
  Filled 2024-03-04: qty 1

## 2024-03-04 MED ORDER — BUPIVACAINE-EPINEPHRINE (PF) 0.5% -1:200000 IJ SOLN
INTRAMUSCULAR | Status: AC
  Filled 2024-03-04: qty 30

## 2024-03-04 MED ORDER — HEPARIN SODIUM (PORCINE) 5000 UNIT/ML IJ SOLN
5000.0000 [IU] | INTRAMUSCULAR | Status: AC
  Administered 2024-03-04: 5000 [IU] via SUBCUTANEOUS

## 2024-03-04 MED ORDER — DEXMEDETOMIDINE HCL IN NACL 80 MCG/20ML IV SOLN
INTRAVENOUS | Status: AC
  Filled 2024-03-04: qty 40

## 2024-03-04 MED ORDER — CELECOXIB 200 MG PO CAPS
ORAL_CAPSULE | ORAL | Status: AC
  Filled 2024-03-04: qty 1

## 2024-03-04 MED ORDER — ONDANSETRON HCL 4 MG/2ML IJ SOLN
INTRAMUSCULAR | Status: AC
  Filled 2024-03-04: qty 2

## 2024-03-04 MED ORDER — LACTATED RINGERS IV SOLN: INTRAVENOUS | Status: DC

## 2024-03-04 MED ORDER — GABAPENTIN 300 MG PO CAPS
300.0000 mg | ORAL_CAPSULE | ORAL | Status: AC
  Administered 2024-03-04: 300 mg via ORAL

## 2024-03-04 MED ORDER — OXYCODONE HCL 5 MG PO TABS: 5.0000 mg | ORAL_TABLET | Freq: Once | ORAL | Status: DC | PRN

## 2024-03-04 MED ORDER — ACETAMINOPHEN 500 MG PO TABS
1000.0000 mg | ORAL_TABLET | Freq: Once | ORAL | Status: AC
  Administered 2024-03-04: 1000 mg via ORAL

## 2024-03-04 MED ORDER — ROCURONIUM BROMIDE 10 MG/ML (PF) SYRINGE
PREFILLED_SYRINGE | INTRAVENOUS | Status: AC
  Filled 2024-03-04: qty 10

## 2024-03-04 MED ORDER — PROPOFOL 10 MG/ML IV BOLUS
INTRAVENOUS | Status: AC
  Filled 2024-03-04: qty 20

## 2024-03-04 MED ORDER — ACETAMINOPHEN 500 MG PO TABS: 1000.0000 mg | ORAL_TABLET | ORAL | Status: AC

## 2024-03-04 MED ORDER — BUPIVACAINE HCL (PF) 0.5 % IJ SOLN
INTRAMUSCULAR | Status: DC | PRN
  Administered 2024-03-04: 30 mL

## 2024-03-04 MED ORDER — GABAPENTIN 300 MG PO CAPS
ORAL_CAPSULE | ORAL | Status: AC
  Filled 2024-03-04: qty 1

## 2024-03-04 MED ORDER — LIDOCAINE 2% (20 MG/ML) 5 ML SYRINGE
INTRAMUSCULAR | Status: DC | PRN
  Administered 2024-03-04: 60 mg via INTRAVENOUS

## 2024-03-04 MED ORDER — SUGAMMADEX SODIUM 200 MG/2ML IV SOLN
INTRAVENOUS | Status: DC | PRN
  Administered 2024-03-04: 300 mg via INTRAVENOUS

## 2024-03-04 MED ORDER — DEXAMETHASONE SODIUM PHOSPHATE 10 MG/ML IJ SOLN
INTRAMUSCULAR | Status: DC | PRN
  Administered 2024-03-04: 10 mg via INTRAVENOUS

## 2024-03-04 MED ORDER — VANCOMYCIN HCL IN DEXTROSE 1-5 GM/200ML-% IV SOLN
INTRAVENOUS | Status: AC
  Filled 2024-03-04: qty 200

## 2024-03-04 MED ORDER — VANCOMYCIN HCL IN DEXTROSE 1-5 GM/200ML-% IV SOLN
1000.0000 mg | INTRAVENOUS | Status: AC
  Administered 2024-03-04: 1000 mg via INTRAVENOUS

## 2024-03-04 MED ORDER — MIDAZOLAM HCL 5 MG/5ML IJ SOLN
INTRAMUSCULAR | Status: DC | PRN
  Administered 2024-03-04: 2 mg via INTRAVENOUS

## 2024-03-04 MED ORDER — OXYCODONE HCL 5 MG/5ML PO SOLN: 5.0000 mg | Freq: Once | ORAL | Status: DC | PRN

## 2024-03-04 MED ORDER — POVIDONE-IODINE 10 % EX SOLN
CUTANEOUS | Status: DC | PRN
  Administered 2024-03-04: 1 via TOPICAL

## 2024-03-04 MED ORDER — HYDROMORPHONE HCL 1 MG/ML IJ SOLN
INTRAMUSCULAR | Status: DC | PRN
  Administered 2024-03-04: .5 mg via INTRAVENOUS

## 2024-03-04 MED ORDER — CHLORHEXIDINE GLUCONATE CLOTH 2 % EX PADS: 6.0000 | MEDICATED_PAD | Freq: Once | CUTANEOUS | Status: DC

## 2024-03-04 MED ORDER — FENTANYL CITRATE (PF) 100 MCG/2ML IJ SOLN
25.0000 ug | INTRAMUSCULAR | Status: DC | PRN
  Administered 2024-03-04 (×2): 25 ug via INTRAVENOUS

## 2024-03-04 MED ORDER — KETOROLAC TROMETHAMINE 30 MG/ML IJ SOLN
INTRAMUSCULAR | Status: AC
  Filled 2024-03-04: qty 1

## 2024-03-04 MED ORDER — ACETAMINOPHEN 500 MG PO TABS
ORAL_TABLET | ORAL | Status: AC
  Filled 2024-03-04: qty 2

## 2024-03-04 MED ORDER — SUCCINYLCHOLINE CHLORIDE 200 MG/10ML IV SOSY
PREFILLED_SYRINGE | INTRAVENOUS | Status: AC
  Filled 2024-03-04: qty 10

## 2024-03-04 MED ORDER — LIDOCAINE 2% (20 MG/ML) 5 ML SYRINGE
INTRAMUSCULAR | Status: AC
  Filled 2024-03-04: qty 5

## 2024-03-04 MED ORDER — MIDAZOLAM HCL 2 MG/2ML IJ SOLN
INTRAMUSCULAR | Status: AC
  Filled 2024-03-04: qty 2

## 2024-03-04 MED ORDER — HYDROMORPHONE HCL 1 MG/ML IJ SOLN
INTRAMUSCULAR | Status: AC
Start: 2024-03-04 — End: ?
  Filled 2024-03-04: qty 0.5

## 2024-03-04 MED ORDER — PROPOFOL 10 MG/ML IV BOLUS
INTRAVENOUS | Status: DC | PRN
  Administered 2024-03-04: 50 mg via INTRAVENOUS
  Administered 2024-03-04: 200 mg via INTRAVENOUS

## 2024-03-04 MED ORDER — AMISULPRIDE (ANTIEMETIC) 5 MG/2ML IV SOLN: 10.0000 mg | Freq: Once | INTRAVENOUS | Status: DC | PRN

## 2024-03-04 MED ORDER — PHENYLEPHRINE HCL (PRESSORS) 10 MG/ML IV SOLN
INTRAVENOUS | Status: DC | PRN
  Administered 2024-03-04: 80 ug via INTRAVENOUS
  Administered 2024-03-04: 160 ug via INTRAVENOUS
  Administered 2024-03-04: 80 ug via INTRAVENOUS

## 2024-03-04 MED ORDER — VASHE WOUND IRRIGATION OPTIME
TOPICAL | Status: DC | PRN
  Administered 2024-03-04: 34 [oz_av]

## 2024-03-04 MED ORDER — VANCOMYCIN HCL 500 MG/100ML IV SOLN
INTRAVENOUS | Status: AC
  Filled 2024-03-04: qty 100

## 2024-03-04 MED ORDER — ONDANSETRON HCL 4 MG/2ML IJ SOLN
INTRAMUSCULAR | Status: DC | PRN
  Administered 2024-03-04: 4 mg via INTRAVENOUS

## 2024-03-04 MED ORDER — 0.9 % SODIUM CHLORIDE (POUR BTL) OPTIME
TOPICAL | Status: DC | PRN
  Administered 2024-03-04: 1000 mL

## 2024-03-04 MED ORDER — BUPIVACAINE HCL (PF) 0.5 % IJ SOLN
INTRAMUSCULAR | Status: AC
  Filled 2024-03-04: qty 30

## 2024-03-04 MED ORDER — ROCURONIUM BROMIDE 100 MG/10ML IV SOLN
INTRAVENOUS | Status: DC | PRN
  Administered 2024-03-04: 70 mg via INTRAVENOUS

## 2024-03-04 SURGICAL SUPPLY — 63 items
ALLOGRAFT PERF MED 20.1X23.6 (Tissue) IMPLANT
BAG DECANTER FOR FLEXI CONT (MISCELLANEOUS) ×1 IMPLANT
BINDER BREAST LRG (GAUZE/BANDAGES/DRESSINGS) IMPLANT
BINDER BREAST MEDIUM (GAUZE/BANDAGES/DRESSINGS) IMPLANT
BINDER BREAST XLRG (GAUZE/BANDAGES/DRESSINGS) IMPLANT
BINDER BREAST XXLRG (GAUZE/BANDAGES/DRESSINGS) IMPLANT
BLADE SURG 10 STRL SS (BLADE) ×4 IMPLANT
BLADE SURG 15 STRL LF DISP TIS (BLADE) IMPLANT
BNDG ELASTIC 6INX 5YD STR LF (GAUZE/BANDAGES/DRESSINGS) IMPLANT
BNDG GAUZE DERMACEA FLUFF 4 (GAUZE/BANDAGES/DRESSINGS) ×2 IMPLANT
CANISTER SUCT 1200ML W/VALVE (MISCELLANEOUS) ×1 IMPLANT
CHLORAPREP W/TINT 26 (MISCELLANEOUS) ×1 IMPLANT
COVER BACK TABLE 60X90IN (DRAPES) ×1 IMPLANT
COVER MAYO STAND STRL (DRAPES) ×1 IMPLANT
DERMABOND ADVANCED .7 DNX12 (GAUZE/BANDAGES/DRESSINGS) ×1 IMPLANT
DRAIN CHANNEL 15F RND FF W/TCR (WOUND CARE) ×1 IMPLANT
DRAIN CHANNEL 19F RND (DRAIN) IMPLANT
DRAPE TOP ARMCOVERS (MISCELLANEOUS) ×1 IMPLANT
DRAPE U-SHAPE 76X120 STRL (DRAPES) ×1 IMPLANT
DRAPE UTILITY XL STRL (DRAPES) ×1 IMPLANT
ELECT COATED BLADE 2.86 ST (ELECTRODE) ×1 IMPLANT
ELECTRODE BLDE 4.0 EZ CLN MEGD (MISCELLANEOUS) ×1 IMPLANT
ELECTRODE REM PT RTRN 9FT ADLT (ELECTROSURGICAL) ×1 IMPLANT
EVACUATOR SILICONE 100CC (DRAIN) ×1 IMPLANT
FUNNEL KELLER 2 DISP (MISCELLANEOUS) IMPLANT
GAUZE PAD ABD 8X10 STRL (GAUZE/BANDAGES/DRESSINGS) ×2 IMPLANT
GLOVE BIO SURGEON STRL SZ 6 (GLOVE) ×1 IMPLANT
GLOVE BIO SURGEON STRL SZ 6.5 (GLOVE) IMPLANT
GOWN STRL REUS W/ TWL LRG LVL3 (GOWN DISPOSABLE) ×2 IMPLANT
IMPL BREAST P5.7XFULL RND 560 (Breast) IMPLANT
IV NS 500ML BAXH (IV SOLUTION) ×1 IMPLANT
KIT FILL ASEPTIC TRANSFER (MISCELLANEOUS) IMPLANT
MARKER SKIN DUAL TIP RULER LAB (MISCELLANEOUS) IMPLANT
NDL HYPO 25X1 1.5 SAFETY (NEEDLE) IMPLANT
NEEDLE HYPO 25X1 1.5 SAFETY (NEEDLE) ×1 IMPLANT
PACK BASIN DAY SURGERY FS (CUSTOM PROCEDURE TRAY) ×1 IMPLANT
PENCIL SMOKE EVACUATOR (MISCELLANEOUS) ×1 IMPLANT
PIN SAFETY STERILE (MISCELLANEOUS) ×1 IMPLANT
PUNCH BIOPSY 4MM DISP (MISCELLANEOUS) IMPLANT
SHEET MEDIUM DRAPE 40X70 STRL (DRAPES) ×1 IMPLANT
SIZER BRST STRL LF S 560CC (SIZER) IMPLANT
SLEEVE SCD COMPRESS KNEE MED (STOCKING) ×1 IMPLANT
SPIKE FLUID TRANSFER (MISCELLANEOUS) IMPLANT
SPONGE T-LAP 18X18 ~~LOC~~+RFID (SPONGE) ×2 IMPLANT
STAPLER SKIN PROX WIDE 3.9 (STAPLE) ×1 IMPLANT
SUT ETHILON 2 0 FS 18 (SUTURE) ×1 IMPLANT
SUT MNCRL AB 4-0 PS2 18 (SUTURE) ×1 IMPLANT
SUT PDS 3-0 CT2 (SUTURE) IMPLANT
SUT PDS AB 2-0 CT2 27 (SUTURE) IMPLANT
SUT PDS II 3-0 CT2 27 ABS (SUTURE) IMPLANT
SUT PROLENE 2 0 CT2 30 (SUTURE) IMPLANT
SUT VIC AB 3-0 PS1 18XBRD (SUTURE) IMPLANT
SUT VIC AB 3-0 SH 27X BRD (SUTURE) IMPLANT
SUT VIC AB 4-0 PS2 18 (SUTURE) ×1 IMPLANT
SUTURE STRATFX 0 PDS 27 VIOLET (SUTURE) IMPLANT
SYR 10ML LL (SYRINGE) IMPLANT
SYR 50ML LL SCALE MARK (SYRINGE) IMPLANT
SYR BULB IRRIG 60ML STRL (SYRINGE) ×1 IMPLANT
SYR CONTROL 10ML LL (SYRINGE) IMPLANT
TOWEL GREEN STERILE FF (TOWEL DISPOSABLE) ×2 IMPLANT
TUBE CONNECTING 20X1/4 (TUBING) ×1 IMPLANT
UNDERPAD 30X36 HEAVY ABSORB (UNDERPADS AND DIAPERS) ×2 IMPLANT
YANKAUER SUCT BULB TIP NO VENT (SUCTIONS) ×1 IMPLANT

## 2024-03-04 NOTE — Anesthesia Postprocedure Evaluation (Signed)
 Anesthesia Post Note  Patient: Pamela Medina  Procedure(s) Performed: RECONSTRUCTION, BREAST WITH PLACEMENT OF SILICONE IMPLANTS (Bilateral: Chest) APPLICATION ALLODERM BILATERAL CHEST (Bilateral: Chest)     Patient location during evaluation: PACU Anesthesia Type: General Level of consciousness: awake and alert Pain management: pain level controlled Vital Signs Assessment: post-procedure vital signs reviewed and stable Respiratory status: spontaneous breathing, nonlabored ventilation and respiratory function stable Cardiovascular status: stable and blood pressure returned to baseline Anesthetic complications: no   No notable events documented.  Last Vitals:  Vitals:   03/04/24 1146 03/04/24 1200  BP: 129/73 (!) 132/92  Pulse: 84 80  Resp: 12 16  Temp:    SpO2: 93% 95%    Last Pain:  Vitals:   03/04/24 1146  TempSrc:   PainSc: Asleep                 Juventino Oppenheim

## 2024-03-04 NOTE — Interval H&P Note (Signed)
 History and Physical Interval Note:  03/04/2024 6:44 AM  Pamela Medina  has presented today for surgery, with the diagnosis of History breast cancer acquired absence breasts.  The various methods of treatment have been discussed with the patient and family. After consideration of risks, benefits and other options for treatment, the patient has consented to bilateral breast reconstruction with silicone implant acellular dermis possible tissue expander placement a surgical intervention.  The patient's history has been reviewed, patient examined, no change in status, stable for surgery.  I have reviewed the patient's chart and labs.  Questions were answered to the patient's satisfaction.     Pamela Medina

## 2024-03-04 NOTE — Transfer of Care (Signed)
 Immediate Anesthesia Transfer of Care Note  Patient: Pamela Medina  Procedure(s) Performed: RECONSTRUCTION, BREAST WITH PLACEMENT OF SILICONE IMPLANTS (Bilateral: Chest) APPLICATION ALLODERM BILATERAL CHEST (Bilateral: Chest)  Patient Location: PACU  Anesthesia Type:General  Level of Consciousness: drowsy  Airway & Oxygen Therapy: Patient Spontanous Breathing and Patient connected to face mask oxygen  Post-op Assessment: Report given to RN and Post -op Vital signs reviewed and stable  Post vital signs: Reviewed and stable  Last Vitals:  Vitals Value Taken Time  BP 153/83 03/04/24 1032  Temp 36.2 C 03/04/24 1032  Pulse 87 03/04/24 1034  Resp 15 03/04/24 1034  SpO2 95 % 03/04/24 1034  Vitals shown include unfiled device data.  Last Pain:  Vitals:   03/04/24 0629  TempSrc: Oral  PainSc: 0-No pain      Patients Stated Pain Goal: 6 (03/04/24 1610)  Complications: No notable events documented.

## 2024-03-04 NOTE — Discharge Instructions (Signed)
 May take Tylenol  after 12:30pm, if needed.  May take NSAIDS (ibuprofen/motrin) after 12:30 pm, if needed.    Post Anesthesia Home Care Instructions  Activity: Get plenty of rest for the remainder of the day. A responsible individual must stay with you for 24 hours following the procedure.  For the next 24 hours, DO NOT: -Drive a car -Advertising copywriter -Drink alcoholic beverages -Take any medication unless instructed by your physician -Make any legal decisions or sign important papers.  Meals: Start with liquid foods such as gelatin or soup. Progress to regular foods as tolerated. Avoid greasy, spicy, heavy foods. If nausea and/or vomiting occur, drink only clear liquids until the nausea and/or vomiting subsides. Call your physician if vomiting continues.  Special Instructions/Symptoms: Your throat may feel dry or sore from the anesthesia or the breathing tube placed in your throat during surgery. If this causes discomfort, gargle with warm salt water. The discomfort should disappear within 24 hours.  If you had a scopolamine patch placed behind your ear for the management of post- operative nausea and/or vomiting:  1. The medication in the patch is effective for 72 hours, after which it should be removed.  Wrap patch in a tissue and discard in the trash. Wash hands thoroughly with soap and water. 2. You may remove the patch earlier than 72 hours if you experience unpleasant side effects which may include dry mouth, dizziness or visual disturbances. 3. Avoid touching the patch. Wash your hands with soap and water after contact with the patch.     Information for Discharge Teaching: EXPAREL  (bupivacaine  liposome injectable suspension)   Pain relief is important to your recovery. The goal is to control your pain so you can move easier and return to your normal activities as soon as possible after your procedure. Your physician may use several types of medicines to manage pain, swelling,  and more.  Your surgeon or anesthesiologist gave you EXPAREL (bupivacaine ) to help control your pain after surgery.  EXPAREL  is a local anesthetic designed to release slowly over an extended period of time to provide pain relief by numbing the tissue around the surgical site. EXPAREL  is designed to release pain medication over time and can control pain for up to 72 hours. Depending on how you respond to EXPAREL , you may require less pain medication during your recovery. EXPAREL  can help reduce or eliminate the need for opioids during the first few days after surgery when pain relief is needed the most. EXPAREL  is not an opioid and is not addictive. It does not cause sleepiness or sedation.   Important! A teal colored band has been placed on your arm with the date, time and amount of EXPAREL  you have received. Please leave this armband in place for the full 96 hours following administration, and then you may remove the band. If you return to the hospital for any reason within 96 hours following the administration of EXPAREL , the armband provides important information that your health care providers to know, and alerts them that you have received this anesthetic.    Possible side effects of EXPAREL : Temporary loss of sensation or ability to move in the area where medication was injected. Nausea, vomiting, constipation Rarely, numbness and tingling in your mouth or lips, lightheadedness, or anxiety may occur. Call your doctor right away if you think you may be experiencing any of these sensations, or if you have other questions regarding possible side effects.  Follow all other discharge instructions given to you by your  surgeon or nurse. Eat a healthy diet and drink plenty of water or other fluids.

## 2024-03-04 NOTE — Op Note (Signed)
 Operative Note   DATE OF OPERATION: 5.2.2025  LOCATION: [facility]-outpatient  SURGICAL DIVISION: Plastic Surgery  PREOPERATIVE DIAGNOSES:  1. History breast cancer 2. Acquired absence breasts  POSTOPERATIVE DIAGNOSES:  same  PROCEDURE:  1. Bilateral breast reconstruction with silicone implants 2. Acellular dermis (Alloderm) to bilateral chest  SURGEON: Alger Infield MD MBA  ASSISTANT: none  ANESTHESIA:  General.   EBL: 50 ml  COMPLICATIONS: None immediate.   INDICATIONS FOR PROCEDURE:  The patient, Pamela Medina, is a 58 y.o. female born on [birth year], is here for staged breast reconstruction following Goldilocks and LICAP flap reconstruction.  FINDINGS: Natrelle Soft Touch Smooth Round Full Projection 560 ml implants placed bilateral. REF [id number] RIGHT SN [id number] LEFT SN [id number]  DESCRIPTION OF PROCEDURE:   The patient was marked with the patient in the preoperative area. Subcutaneous heparin  administered. The patient's operative site was prepped and draped in a sterile fashion. A time out was performed and all information was confirmed to be correct. Incision made in left inframammary fold scar and carried through soft tissue to pectoralis muscle surface. Mastectomy flap and prior soft tissue reconstruction elevated off pectoralis muscle to dimensions of anticipated implant placement. Cavity irrigated with hypochlorous acid solution and saline Betadine solution. Local anesthetic infiltrated. A 19 Fr drain was placed in subcutaneous position and secured to skin with 2-0 nylon. The implant was prepared on back table prior in insertion. Perforated acellular dermis was draped over anterior surface implant. The ADM was then secured to itself over posterior surface of expander with 2-0 PDS spanning sutures. The implant acellular dermis construction was placed in cavity. Implant orientation ensured. The ADM was secured to pectoralis muscle and chest wall along inferior border at  inframammary fold with 0 Strattafix suture. Skin closure completed with 3-0 vicryl in fascial layer and 4-0 vicryl in dermis. Skin closure completed with 4-0 monocryl subcuticular.   I then directed my attention to right chest. Incision made in right inframammary fold scar and carried through and carried through soft tissue to pectoralis muscle surface. Mastectomy flap and prior soft tissue reconstruction elevated off pectoralis muscle to dimensions of anticipated implant placement. Cavity irrigated with hypochlorous acid solution and saline Betadine solution. Local anesthetic infiltrated. A 19 Fr drain was placed in subcutaneous position and secured to skin with 2-0 nylon. The implant was prepared on back table prior in insertion. Perforated acellular dermis was draped over anterior surface implant. The ADM was then secured to itself over posterior surface of expander with 2-0 PDS spanning sutures. The implant acellular dermis construction was placed in cavity. Implant orientation ensured. The ADM was secured to pectoralis muscle and chest wall along inferior border at inframammary fold with 0 Strattafix suture. Skin closure completed with 3-0 vicryl in fascial layer and 4-0 vicryl in dermis. Skin closure completed with 4-0 monocryl subcuticular Dermabond applied over incisions. Tegaderm dressings applied followed by dry dressing, breast binder.  The patient was allowed to wake from anesthesia, extubated and taken to the recovery room in satisfactory condition.   SPECIMENS: none  DRAINS: 19 Fr JP in right and left subcutaneous chest  Alger Infield, MD [facility] Plastic & Reconstructive Surgery  Office/ physician access line after hours [phone]

## 2024-03-04 NOTE — Anesthesia Procedure Notes (Signed)
 Procedure Name: Intubation Date/Time: 03/04/2024 7:39 AM  Performed by: Noralyn Beams, CRNAPre-anesthesia Checklist: Patient identified, Emergency Drugs available, Suction available and Patient being monitored Patient Re-evaluated:Patient Re-evaluated prior to induction Oxygen Delivery Method: Circle system utilized Preoxygenation: Pre-oxygenation with 100% oxygen Induction Type: IV induction Ventilation: Mask ventilation without difficulty and Oral airway inserted - appropriate to patient size Laryngoscope Size: Miller and 2 Grade View: Grade II Tube type: Oral Tube size: 7.0 mm Number of attempts: 1 Airway Equipment and Method: Stylet, Oral airway and Bite block Placement Confirmation: ETT inserted through vocal cords under direct vision, positive ETCO2 and breath sounds checked- equal and bilateral Secured at: 23 cm Tube secured with: Tape Dental Injury: Teeth and Oropharynx as per pre-operative assessment  Comments: DLx1 with Mac 3 by CRNA, grade III view, DLx1 with Miller 2 by MD, grade IIb view; ETT passed easily through cords, placement verified with equal BBS and +ETCO2

## 2024-03-07 ENCOUNTER — Encounter (HOSPITAL_BASED_OUTPATIENT_CLINIC_OR_DEPARTMENT_OTHER): Payer: Self-pay | Admitting: Plastic Surgery

## 2024-03-10 ENCOUNTER — Ambulatory Visit: Payer: Commercial Managed Care - PPO | Admitting: Podiatry

## 2024-03-10 ENCOUNTER — Encounter: Payer: Self-pay | Admitting: Podiatry

## 2024-03-10 DIAGNOSIS — B351 Tinea unguium: Secondary | ICD-10-CM | POA: Diagnosis not present

## 2024-03-10 MED ORDER — TERBINAFINE HCL 250 MG PO TABS: 250.0000 mg | ORAL_TABLET | Freq: Every day | ORAL | 0 refills | Status: DC

## 2024-03-10 NOTE — Progress Notes (Signed)
  Subjective:  Patient ID: Pamela Medina, female    DOB: [birth year],  MRN: [id number]  No chief complaint on file.   58 y.o. female presents with the above complaint. History confirmed with patient.  Did well with Lamisil  and Jublia , did not like the taste of the Lamisil  but had no systemic side effects  Objective:  Physical Exam: warm, good capillary refill, no trophic changes or ulcerative lesions, normal DP and PT pulses, normal sensory exam, onychomycosis, and right hallux showing about 30% proximal clearance.     Assessment:   1. Onychomycosis      Plan:  Patient was evaluated and treated and all questions answered.  Lamisil  and Jublia  dual therapy is showing progress.  I recommend continuing both.  Refill of Lamisil  sent to pharmacy.  She should notify me if there are any side effects developing at this point.  Should have refills on the efinaconazole  to fill as needed she otherwise she will let me know.  Return in 4 months to reevaluate.  New photographs taken.  Return in about 4 months (around 07/11/2024) for follow up after nail fungus treatment.

## 2024-03-18 ENCOUNTER — Encounter: Payer: Self-pay | Admitting: Internal Medicine

## 2024-03-18 DIAGNOSIS — J309 Allergic rhinitis, unspecified: Secondary | ICD-10-CM

## 2024-03-21 NOTE — Telephone Encounter (Signed)
 Okay to refer?

## 2024-03-24 ENCOUNTER — Other Ambulatory Visit: Payer: Self-pay

## 2024-03-24 ENCOUNTER — Encounter: Payer: Self-pay | Admitting: Family Medicine

## 2024-03-24 ENCOUNTER — Encounter: Payer: Self-pay | Admitting: Podiatry

## 2024-03-24 MED ORDER — TERBINAFINE HCL 250 MG PO TABS: 250.0000 mg | ORAL_TABLET | Freq: Every day | ORAL | 2 refills | Status: AC

## 2024-03-24 NOTE — Telephone Encounter (Signed)
 Can you please ensure that Siegfried Dress is still valid from 1/30?? BILAT knees  We have spare Zilretta  in stock.

## 2024-03-24 NOTE — Progress Notes (Signed)
 I, I, Leone Ralphs am a scribe for Dr. Garlan Juniper, MD.  Pamela Medina is a 58 y.o. female who presents to [facility] at [facility] today for exacerbation of her bilat knee pain. Pt was last seen by Dr. Alease Hunter on 02/12/24 and was and was given bilat knee steroid injections and we worked to authorize Zilretta .  Today, pt reports knees are just stiff. Heel is sharp pain when she puts pressure on it and when she lifts her foot. [place] the airport on [place] and thinks she might have done something to it. Zilretta  injections wanted today.  Dx imaging: 07/17/23 R & L knee XR 07/05/20 R & L knee XR   Pertinent review of systems: No fevers or chills  Relevant historical information: Hypertension   Exam:  BP 110/70   Pulse 71   Ht 5\' 10"  (1.778 m)   Wt (!) 311 lb 6.4 oz (141.3 kg)   SpO2 98%   BMI 44.68 kg/m  General: Well Developed, well nourished, and in no acute distress.   MSK: Knees bilaterally mild effusion normal. Normal motion.  Right foot: Normal appearing To palpation plantar calcaneus.    Lab and Radiology Results   Zilretta  injection bilateral knee Procedure: Real-time Ultrasound Guided Injection of right knee joint superior lateral patellar space Device: Philips Affiniti 50G Images permanently stored and available for review in PACS Verbal informed consent obtained.  Discussed risks and benefits of procedure. Warned about infection, hyperglycemia bleeding, damage to structures among others. Patient expresses understanding and agreement Time-out conducted.   Noted no overlying erythema, induration, or other signs of local infection.   Skin prepped in a sterile fashion.   Local anesthesia: Topical Ethyl chloride.   With sterile technique and under real time ultrasound guidance: Zilretta  32 mg injected into knee joint. Fluid seen entering the joint capsule.   Completed without difficulty   Advised to call if fevers/chills, erythema,  induration, drainage, or persistent bleeding.   Images permanently stored and available for review in the ultrasound unit.  Impression: Technically successful ultrasound guided injection.   Procedure: Real-time Ultrasound Guided Injection of left knee joint superior lateral patellar space Device: Philips Affiniti 50G Images permanently stored and available for review in PACS Verbal informed consent obtained.  Discussed risks and benefits of procedure. Warned about infection, hyperglycemia bleeding, damage to structures among others. Patient expresses understanding and agreement Time-out conducted.   Noted no overlying erythema, induration, or other signs of local infection.   Skin prepped in a sterile fashion.   Local anesthesia: Topical Ethyl chloride.   With sterile technique and under real time ultrasound guidance: Zilretta  32 mg injected into knee joint. Fluid seen entering the joint capsule.   Completed without difficulty   Advised to call if fevers/chills, erythema, induration, drainage, or persistent bleeding.   Images permanently stored and available for review in the ultrasound unit.  Impression: Technically successful ultrasound guided injection.  Lot number: [id number]  Quick look ultrasound right plantar foot intact plantar fascia.   Assessment and Plan: 58 y.o. female with bilateral knee pain due to exacerbation of DJD.  Plan for Zilretta  injection today.  Right foot pain plantar fasciitis.  Plan for eccentric exercises and ice.  Consider CAM Walker boot if needed.   PDMP not reviewed this encounter. Orders Placed This Encounter  Procedures   US  LIMITED JOINT SPACE STRUCTURES LOW RIGHT(NO LINKED CHARGES)    Reason for Exam (SYMPTOM  OR DIAGNOSIS REQUIRED):   heel  pain    Preferred imaging location?:   [facility]: Lambert Rhonda W ordered this encounter  Medications   Triamcinolone  Acetonide (ZILRETTA ) intra-articular injection 32 mg   Triamcinolone   Acetonide (ZILRETTA ) intra-articular injection 32 mg     Discussed warning signs or symptoms. Please see discharge instructions. Patient expresses understanding.   The above documentation has been reviewed and is accurate and complete Garlan Juniper, M.D.

## 2024-03-25 ENCOUNTER — Other Ambulatory Visit: Payer: Self-pay | Admitting: Podiatry

## 2024-03-25 ENCOUNTER — Other Ambulatory Visit: Payer: Self-pay

## 2024-03-25 ENCOUNTER — Ambulatory Visit: Admitting: Family Medicine

## 2024-03-25 VITALS — BP 110/70 | HR 71 | Ht 70.0 in | Wt 311.4 lb

## 2024-03-25 DIAGNOSIS — M25561 Pain in right knee: Secondary | ICD-10-CM | POA: Diagnosis not present

## 2024-03-25 DIAGNOSIS — M79671 Pain in right foot: Secondary | ICD-10-CM

## 2024-03-25 DIAGNOSIS — M170 Bilateral primary osteoarthritis of knee: Secondary | ICD-10-CM

## 2024-03-25 DIAGNOSIS — M25562 Pain in left knee: Secondary | ICD-10-CM

## 2024-03-25 DIAGNOSIS — G8929 Other chronic pain: Secondary | ICD-10-CM

## 2024-03-25 MED ORDER — TRIAMCINOLONE ACETONIDE 32 MG IX SRER
32.0000 mg | Freq: Once | INTRA_ARTICULAR | Status: AC
  Administered 2024-03-25: 32 mg via INTRA_ARTICULAR

## 2024-03-25 NOTE — Patient Instructions (Addendum)
 Thank you for coming in today.  Cam walker boot if needed.  Please work on the home exercises the athletic trainer went over with you:  View at [email or web address] code [email or web address]  You received an injection today. Seek immediate medical attention if the joint becomes red, extremely painful, or is oozing fluid.

## 2024-04-27 ENCOUNTER — Other Ambulatory Visit: Payer: Self-pay

## 2024-04-27 ENCOUNTER — Ambulatory Visit: Admitting: Allergy

## 2024-04-27 ENCOUNTER — Encounter: Payer: Self-pay | Admitting: Allergy

## 2024-04-27 VITALS — BP 118/80 | HR 73 | Temp 97.9°F | Resp 20 | Ht 70.5 in | Wt 308.0 lb

## 2024-04-27 DIAGNOSIS — T781XXD Other adverse food reactions, not elsewhere classified, subsequent encounter: Secondary | ICD-10-CM

## 2024-04-27 DIAGNOSIS — H109 Unspecified conjunctivitis: Secondary | ICD-10-CM

## 2024-04-27 DIAGNOSIS — J4520 Mild intermittent asthma, uncomplicated: Secondary | ICD-10-CM | POA: Diagnosis not present

## 2024-04-27 DIAGNOSIS — J310 Chronic rhinitis: Secondary | ICD-10-CM

## 2024-04-27 DIAGNOSIS — H1013 Acute atopic conjunctivitis, bilateral: Secondary | ICD-10-CM

## 2024-04-27 MED ORDER — RYALTRIS 665-25 MCG/ACT NA SUSP: NASAL | 3 refills | Status: AC

## 2024-04-27 MED ORDER — ALBUTEROL SULFATE HFA 108 (90 BASE) MCG/ACT IN AERS: INHALATION_SPRAY | RESPIRATORY_TRACT | 1 refills | Status: AC

## 2024-04-27 NOTE — Patient Instructions (Addendum)
 Allergic rhinitis with conjunctivitis Blood tests in the past confirmed allergies. Skin testing recommended for comprehensive results.  Current medications provide partial relief. - Schedule skin testing for environmental allergens.  Hold antihistamines for 3 days prior to this visit.   - Prescribe Ryaltris nasal spray if covered by insurance.   Ryaltris has 2 ingredients: mometasone for congestion control and olopatadine for drainage control.  Use 2 sprays each nostril twice a day as needed for runny or stuffy nose.   If not covered with insurance then can get over-the-counter Flonase /Nasacort /Rhinocort (steroid sprays) and azelastine/astepro (antihistamine spray). With using nasal sprays point tip of bottle toward eye on same side nostril and lean head slightly forward for best technique.   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 Cooked fruits tolerated. - Schedule skin testing for food allergens. - Educated on low risk of anaphylaxis with oral allergy syndrome. - Discuss potential need for epinephrine  auto-injector if food allergies are confirmed.  The oral allergy syndrome (OAS) or pollen-food allergy syndrome (PFAS) is a relatively common form of food allergy, particularly in adults. It typically occurs in people who have pollen allergies when the immune system sees proteins on the food that look like proteins on the pollen. This results in the allergy antibody (IgE) binding to the food instead of the pollen. Patients typically report itching and/or mild swelling of the mouth and throat immediately following ingestion of certain uncooked fruits (including nuts) or raw vegetables. Only a very small number of affected individuals experience systemic allergic reactions, such as anaphylaxis which occurs with true food allergies.       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 No inhaler use in the past decade, but may benefit from availability during severe episodes. Pulmonary function testing is normal today. - Prescribe albuterol inhaler 2 puffs every 4-6 hours as needed for cough/wheeze/shortness of breath/chest tightness.  May use 15-20 minutes prior to activity.   Monitor frequency of use.    Schedule skin testing visit and hold antihistamines for 3 days prior.  Follow-up in 4-6 months or sooner if needed
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
## 2024-05-12 ENCOUNTER — Encounter: Payer: Self-pay | Admitting: Allergy

## 2024-05-12 ENCOUNTER — Ambulatory Visit: Admitting: Allergy

## 2024-05-12 DIAGNOSIS — H109 Unspecified conjunctivitis: Secondary | ICD-10-CM

## 2024-05-12 DIAGNOSIS — H1013 Acute atopic conjunctivitis, bilateral: Secondary | ICD-10-CM | POA: Diagnosis not present

## 2024-05-12 DIAGNOSIS — T781XXD Other adverse food reactions, not elsewhere classified, subsequent encounter: Secondary | ICD-10-CM | POA: Diagnosis not present

## 2024-05-12 DIAGNOSIS — J310 Chronic rhinitis: Secondary | ICD-10-CM | POA: Diagnosis not present

## 2024-05-12 NOTE — Progress Notes (Signed)
 Follow-up Note  RE: Pamela Medina MRN: [id number] DOB: 1966/09/16 Date of Office Visit: 05/12/2024   History of present illness: Pamela Medina is a 58 y.o. female presenting today for skin testing visit.  She was last seen in the office on 04/27/2024 for rhinoconjunctivitis.  She is in her usual state of health today without recent illness.  She has held antihistamines for at least 3 days for testing today.  Medication List: Current Outpatient Medications  Medication Sig Dispense Refill   albuterol  (VENTOLIN  HFA) 108 (90 Base) MCG/ACT inhaler Inhale 2 puffs into the lungs every 4-6 hours as needed for wheezing and shortness of breath 18 g 1   amLODipine  (NORVASC ) 5 MG tablet TAKE 1 TABLET(5 MG) BY MOUTH DAILY 90 tablet 1   anastrozole (ARIMIDEX) 1 MG tablet Take 1 mg by mouth daily.     Efinaconazole  10 % SOLN Apply 1 drop topically daily. 4 mL 11   Olopatadine-Mometasone (RYALTRIS ) 665-25 MCG/ACT SUSP Use 2 sprays each nostril twice a day as needed for runny or stuffy nose. 29 g 3   terbinafine  (LAMISIL ) 250 MG tablet Take 1 tablet (250 mg total) by mouth daily. 30 tablet 2   No current facility-administered medications for this visit.     Known medication allergies: Allergies  Allergen Reactions   Ciprofloxacin Hives and Nausea And Vomiting   Other     Tree Nuts  Fresh fruit   Penicillins Hives and Nausea Only    Diagnostics/Labs:  Allergy  testing:   Airborne Adult Perc - 05/12/24 1335     Time Antigen Placed 1335    Allergen Manufacturer Jestine    Location Back    Number of Test 55    1. Control-Buffer 50% Glycerol Negative    2. Control-Histamine 2+    3. [place]ia 4+    4. [place] 4+    5. Johnson 4+    6. [place]  Blue 4+    7. Meadow Fescue 4+    8. Perennial Rye 4+    9. Timothy 4+    10. Ragweed Mix Negative    11. Cocklebur 3+    12. Plantain,  English 2+    13. Baccharis Negative    14. Dog Fennel 2+    15. [place] Thistle 2+    16. Lamb's  Quarters 2+    17. Sheep Sorrell 2+    18. Rough Pigweed 3+    19. Marsh Elder, Rough 3+    20. Mugwort, Common Negative    21. Box, Elder 2+    22. Cedar, red Negative    23. Sweet Gum 3+    24. Pecan Pollen 3+    25. Pine Mix Negative    26. Walnut, Black Pollen 2+    27. Red Mulberry 4+    28. Ash Mix 3+    29. Birch Mix 3+    30. Beech American 2+    31. Cottonwood, [place] 2+    32. Hickory, White 2+    33. Maple Mix Negative    34. Oak, [place] Mix Negative    35. Sycamore Eastern 2+    36. Alternaria Alternata 2+    37. Cladosporium Herbarum Negative    38. Aspergillus Mix Negative    39. Penicillium Mix Negative    40. Bipolaris Sorokiniana (Helminthosporium) Negative    41. Drechslera Spicifera (Curvularia) Negative    42. Mucor Plumbeus Negative    43. Fusarium Moniliforme Negative  44. Aureobasidium Pullulans (pullulara) Negative    45. Rhizopus Oryzae Negative    46. Botrytis Cinera Negative    47. Epicoccum Nigrum Negative    48. Phoma Betae Negative    49. Dust Mite Mix Negative    50. Cat Hair 10,000 BAU/ml Negative    51.  Dog Epithelia Negative    52. Mixed Feathers Negative    53. Horse Epithelia Negative    54. Cockroach, German Negative    55. Tobacco Leaf Negative          Food Adult Perc - 05/12/24 1300     Time Antigen Placed 1336    Allergen Manufacturer Jestine    Location Back    Number of allergen test 20    10. Cashew Negative    11. Walnut Food Negative    12. Almond Negative    13. Hazelnut Negative    14. Pecan Food Negative    15. Pistachio Negative    16. [place] Nut Negative    17. Coconut Negative    54. Grape (White seedless) Negative    55. Orange  Negative    56. Lemon Negative    57. Banana Negative    58. Apple Negative    59. Peach Negative    60. Strawberry Negative    61. Blueberry Negative    62. Cherry Negative    63. Cantaloupe Negative    64. Watermelon Negative    65. Pineapple Negative           Allergy  testing results were read and interpreted by provider, documented by clinical staff.   Assessment and plan: Allergic rhinitis with conjunctivitis  - Testing today showed: grasses, weeds, trees, and outdoor molds - Copy of test results provided.  - Avoidance measures provided. - Consider allergy  shots as a means of long-term control. - Allergy  shots re-train and reset the immune system to ignore environmental allergens and decrease the resulting immune response to those allergens (sneezing, itchy watery eyes, runny nose, nasal congestion, etc).    - Allergy  shots improve symptoms in 75-85% of patients.  - We can discuss more at a future appointment if the medications are not working for you.  - Use Ryaltris  nasal spray if covered by insurance.   Ryaltris  has 2 ingredients: mometasone for congestion control and olopatadine for drainage control.  Use 2 sprays each nostril twice a day as needed for runny or stuffy nose.   If not covered with insurance then can get over-the-counter Flonase /Nasacort /Rhinocort (steroid sprays) and azelastine/astepro (antihistamine spray). With using nasal sprays point tip of bottle toward eye on same side nostril and lean head slightly forward for best technique.   - Use Pataday 1 drop each eye daily as needed for itchy/watery eyes  Oral allergy  syndrome - confirmed with testing Oral itching and numbness after consuming fresh fruits and tree nuts, likely related to pollen allergies. Cooked fruits tolerated. - food allergy  testing is negative  The oral allergy  syndrome (OAS) or pollen-food allergy  syndrome (PFAS) is a relatively common form of food allergy , particularly in adults. It typically occurs in people who have pollen allergies when the immune system sees proteins on the food that look like proteins on the pollen. This results in the allergy  antibody (IgE) binding to the food instead of the pollen. Patients typically report itching and/or mild  swelling of the mouth and throat immediately following ingestion of certain uncooked fruits (including nuts) or raw vegetables. Only a very small number  of affected individuals experience systemic allergic reactions, such as anaphylaxis which occurs with true food allergies.    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 No inhaler use in the past decade, but may benefit from availability during severe episodes. Pulmonary function testing is normal today. - Prescribe albuterol  inhaler 2 puffs every 4-6 hours as needed for cough/wheeze/shortness of breath/chest tightness.  May use 15-20 minutes prior to activity.   Monitor frequency of use.    Follow-up in 4-6 months or sooner if needed  I appreciate the opportunity to take part in Galilee's care. Please do not hesitate to contact me with questions.  Sincerely,   Danita Brain, MD Allergy /Immunology [facility]

## 2024-05-12 NOTE — Patient Instructions (Addendum)
 Allergic rhinitis with conjunctivitis  - Testing today showed: grasses, weeds, trees, and outdoor molds - Copy of test results provided.  - Avoidance measures provided. - Consider allergy shots as a means of long-term control. - Allergy shots re-train and reset the immune system to ignore environmental allergens and decrease the resulting immune response to those allergens (sneezing, itchy watery eyes, runny nose, nasal congestion, etc).    - Allergy shots improve symptoms in 75-85% of patients.  - We can discuss more at a future appointment if the medications are not working for you.  - Use Ryaltris  nasal spray if covered by insurance.   Ryaltris  has 2 ingredients: mometasone for congestion control and olopatadine for drainage control.  Use 2 sprays each nostril twice a day as needed for runny or stuffy nose.   If not covered with insurance then can get over-the-counter Flonase /Nasacort /Rhinocort (steroid sprays) and azelastine/astepro (antihistamine spray). With using nasal sprays point tip of bottle toward eye on same side nostril and lean head slightly forward for best technique.   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 Cooked fruits tolerated. - food allergy testing is negative  The oral allergy syndrome (OAS) or pollen-food allergy syndrome (PFAS) is a relatively common form of food allergy, particularly in adults. It typically occurs in people who have pollen allergies when the immune system sees proteins on the food that look like proteins on the pollen. This results in the allergy antibody (IgE) binding to the food instead of the pollen. Patients typically report itching and/or mild swelling of the mouth and throat immediately following ingestion of certain uncooked fruits (including nuts) or raw  vegetables. Only a very small number of affected individuals experience systemic allergic reactions, such as anaphylaxis which occurs with true food allergies.       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 No inhaler use in the past decade, but may benefit from availability during severe episodes. Pulmonary function testing is normal today. - Prescribe albuterol  inhaler 2 puffs every 4-6 hours as needed for cough/wheeze/shortness of breath/chest tightness.  May use 15-20 minutes prior to activity.   Monitor frequency of use.    Follow-up in 4-6 months or sooner if needed

## 2024-05-23 ENCOUNTER — Ambulatory Visit: Admitting: Internal Medicine

## 2024-05-23 ENCOUNTER — Encounter: Payer: Self-pay | Admitting: Internal Medicine

## 2024-05-23 VITALS — BP 120/76 | HR 80 | Ht 70.5 in | Wt 305.0 lb

## 2024-05-23 DIAGNOSIS — E890 Postprocedural hypothyroidism: Secondary | ICD-10-CM

## 2024-05-23 MED ORDER — LEVOTHYROXINE SODIUM 50 MCG PO TABS: 50.0000 ug | ORAL_TABLET | Freq: Every day | ORAL | 2 refills | Status: DC

## 2024-05-23 NOTE — Patient Instructions (Signed)
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
## 2024-06-08 ENCOUNTER — Ambulatory Visit: Admitting: Internal Medicine

## 2024-07-06 ENCOUNTER — Encounter: Payer: Self-pay | Admitting: Internal Medicine

## 2024-07-19 ENCOUNTER — Encounter: Payer: Self-pay | Admitting: Internal Medicine

## 2024-07-19 ENCOUNTER — Ambulatory Visit: Admitting: Internal Medicine

## 2024-07-19 VITALS — BP 110/80 | HR 75 | Temp 98.4°F | Wt 301.3 lb

## 2024-07-19 DIAGNOSIS — H539 Unspecified visual disturbance: Secondary | ICD-10-CM

## 2024-07-19 DIAGNOSIS — M2550 Pain in unspecified joint: Secondary | ICD-10-CM

## 2024-07-19 MED ORDER — MELOXICAM 7.5 MG PO TABS: 7.5000 mg | ORAL_TABLET | Freq: Every day | ORAL | 0 refills | Status: DC
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
## 2024-07-21 ENCOUNTER — Ambulatory Visit: Admitting: Internal Medicine

## 2024-07-21 ENCOUNTER — Ambulatory Visit: Admitting: Podiatry

## 2024-07-26 ENCOUNTER — Other Ambulatory Visit: Payer: Self-pay | Admitting: Internal Medicine

## 2024-07-26 DIAGNOSIS — M2550 Pain in unspecified joint: Secondary | ICD-10-CM

## 2024-07-27 ENCOUNTER — Other Ambulatory Visit

## 2024-08-03 ENCOUNTER — Other Ambulatory Visit

## 2024-08-04 ENCOUNTER — Encounter: Payer: Self-pay | Admitting: Internal Medicine

## 2024-08-04 ENCOUNTER — Ambulatory Visit: Payer: Self-pay | Admitting: Internal Medicine

## 2024-08-04 LAB — TSH: TSH: 1.05 m[IU]/L (ref 0.40–4.50)

## 2024-08-04 LAB — T4, FREE: Free T4: 1.4 ng/dL (ref 0.8–1.8)

## 2024-08-04 NOTE — Telephone Encounter (Signed)
Note sent to [facility]

## 2024-08-08 ENCOUNTER — Other Ambulatory Visit: Payer: Self-pay | Admitting: Internal Medicine

## 2024-08-08 ENCOUNTER — Other Ambulatory Visit (INDEPENDENT_AMBULATORY_CARE_PROVIDER_SITE_OTHER)

## 2024-08-08 DIAGNOSIS — E559 Vitamin D deficiency, unspecified: Secondary | ICD-10-CM

## 2024-08-08 DIAGNOSIS — M2550 Pain in unspecified joint: Secondary | ICD-10-CM | POA: Diagnosis not present

## 2024-08-08 LAB — SEDIMENTATION RATE: Sed Rate: 48 mm/h — ABNORMAL HIGH (ref 0–30)

## 2024-08-08 LAB — VITAMIN D 25 HYDROXY (VIT D DEFICIENCY, FRACTURES): VITD: 21.62 ng/mL — ABNORMAL LOW (ref 30.00–100.00)

## 2024-08-08 LAB — C-REACTIVE PROTEIN: CRP: 2.6 mg/dL (ref 0.5–20.0)

## 2024-08-09 ENCOUNTER — Ambulatory Visit: Payer: Self-pay | Admitting: Internal Medicine

## 2024-08-09 DIAGNOSIS — R700 Elevated erythrocyte sedimentation rate: Secondary | ICD-10-CM

## 2024-08-16 ENCOUNTER — Other Ambulatory Visit: Payer: Self-pay | Admitting: Internal Medicine

## 2024-08-16 ENCOUNTER — Other Ambulatory Visit: Payer: Self-pay

## 2024-08-16 DIAGNOSIS — M2550 Pain in unspecified joint: Secondary | ICD-10-CM

## 2024-08-16 MED ORDER — VITAMIN D (ERGOCALCIFEROL) 1.25 MG (50000 UNIT) PO CAPS: 50000.0000 [IU] | ORAL_CAPSULE | ORAL | 0 refills | Status: AC

## 2024-08-21 ENCOUNTER — Encounter: Payer: Self-pay | Admitting: Family Medicine

## 2024-08-22 ENCOUNTER — Telehealth: Payer: Self-pay

## 2024-08-22 NOTE — Telephone Encounter (Signed)
 Ran benefits for bilateral zilretta  Rjdz#[id number]

## 2024-08-22 NOTE — Telephone Encounter (Signed)
 Can we check insurance benefits and prior auth requirements for continuation of Zilretta  for BILAT knee OA please?  Last Zilretta  injections 03/25/24

## 2024-08-26 ENCOUNTER — Other Ambulatory Visit: Payer: Self-pay

## 2024-08-26 ENCOUNTER — Ambulatory Visit: Admitting: Family Medicine

## 2024-08-26 DIAGNOSIS — M25561 Pain in right knee: Secondary | ICD-10-CM | POA: Diagnosis not present

## 2024-08-26 DIAGNOSIS — G8929 Other chronic pain: Secondary | ICD-10-CM | POA: Diagnosis not present

## 2024-08-26 DIAGNOSIS — M25562 Pain in left knee: Secondary | ICD-10-CM

## 2024-08-26 DIAGNOSIS — M2550 Pain in unspecified joint: Secondary | ICD-10-CM

## 2024-08-26 DIAGNOSIS — M353 Polymyalgia rheumatica: Secondary | ICD-10-CM | POA: Diagnosis not present

## 2024-08-26 DIAGNOSIS — M170 Bilateral primary osteoarthritis of knee: Secondary | ICD-10-CM

## 2024-08-26 LAB — SEDIMENTATION RATE: Sed Rate: 49 mm/h — ABNORMAL HIGH (ref 0–30)

## 2024-08-26 LAB — CK: Total CK: 82 U/L (ref 17–177)

## 2024-08-26 MED ORDER — TRIAMCINOLONE ACETONIDE 32 MG IX SRER
32.0000 mg | Freq: Once | INTRA_ARTICULAR | Status: AC
  Administered 2024-08-26: 32 mg via INTRA_ARTICULAR

## 2024-08-26 NOTE — Telephone Encounter (Signed)
 Can you scheudle atient hen medication is in stock    Zilretta  authorized for bilateral knee NO PRE CERT REQUIRED  Copay $50 Deductible $1000 has met $172.55 OOP MAX $5000 has met $535.99 Once OOP has been met copay will no longer apply Reference # [id number]

## 2024-08-26 NOTE — Progress Notes (Signed)
 I, Leotis Batter, CMA acting as a scribe for Artist Lloyd, MD.  Pamela Medina is a 58 y.o. female who presents to [facility] today for exacerbation of her bilat knee pain. Pt was last seen by Dr. Lloyd on 03/25/24 and was and was given bilat knee Zilretta  injections.  Today, pt reports exacerbation of bilat knee pain. Got about 2 months relief with last Zilretta  injections. Notes R>L knee pain and swelling. Mechanical sx present.  Notes a shifting feeling in the knees when laying in bed. Has tried Meloxicam  with minimal relief. Also using heat/ice.   Also notes general overall joint pain, scheduled to see Rheumatology in Jan, had elevated ESR. Flared up today.   Dx imaging: 07/17/23 R & L knee XR 07/05/20 R & L knee XR   Pertinent review of systems: No fevers or chills  Relevant historical information: Hypertension   Exam:   General: Well Developed, well nourished, and in no acute distress.   MSK: Knees bilaterally mild effusion normal motion.    Lab and Radiology Results   Zilretta  injection bilateral knee Procedure: Real-time Ultrasound Guided Injection of right knee joint superior lateral patellar space Device: Philips Affiniti 50G Images permanently stored and available for review in PACS Verbal informed consent obtained.  Discussed risks and benefits of procedure. Warned about infection, hyperglycemia bleeding, damage to structures among others. Patient expresses understanding and agreement Time-out conducted.   Noted no overlying erythema, induration, or other signs of local infection.   Skin prepped in a sterile fashion.   Local anesthesia: Topical Ethyl chloride.   With sterile technique and under real time ultrasound guidance: Zilretta  32 mg injected into knee joint. Fluid seen entering the joint capsule.   Completed without difficulty   Advised to call if fevers/chills, erythema, induration, drainage, or persistent bleeding.    Images permanently stored and available for review in the ultrasound unit.  Impression: Technically successful ultrasound guided injection.  Procedure: Real-time Ultrasound Guided Injection of left knee joint superior lateral patellar space Device: Philips Affiniti 50G Images permanently stored and available for review in PACS Verbal informed consent obtained.  Discussed risks and benefits of procedure. Warned about infection, hyperglycemia bleeding, damage to structures among others. Patient expresses understanding and agreement Time-out conducted.   Noted no overlying erythema, induration, or other signs of local infection.   Skin prepped in a sterile fashion.   Local anesthesia: Topical Ethyl chloride.   With sterile technique and under real time ultrasound guidance: Zilretta  32 mg injected into knee joint. Fluid seen entering the joint capsule.   Completed without difficulty   Advised to call if fevers/chills, erythema, induration, drainage, or persistent bleeding.   Images permanently stored and available for review in the ultrasound unit.  Impression: Technically successful ultrasound guided injection.  Lot number: [id number]     Assessment and Plan: 58 y.o. female with bilateral knee pain due to DJD.  Plan for Zilretta  injection both knees today.  Additionally she notes diffuse pain across her trunk upper extremities and hands.  She did have a limited inflammatory workup with her PCP and found to have a sed rate of 46 about a month ago.  She was referred to rheumatology and has her first appointment on January 4.  Avoid check rheumatology labs today and coordinate with rheumatologist Dr Luba to see if any further testing will be needed prior to her upcoming visit.  Consider prescribing prednisone if needed.   PDMP not reviewed  this encounter. Orders Placed This Encounter  Procedures   US  LIMITED JOINT SPACE STRUCTURES LOW BILAT(NO LINKED CHARGES)    Reason for Exam (SYMPTOM  OR  DIAGNOSIS REQUIRED):   bilat knee pain    Preferred imaging location?:   [facility]   ANA    Standing Status:   Future    Number of Occurrences:   1    Expiration Date:   08/26/2025   Cyclic citrul peptide antibody, IgG    Standing Status:   Future    Number of Occurrences:   1    Expiration Date:   08/26/2025   HLA-B27 antigen    Polyarthalgia    Standing Status:   Future    Number of Occurrences:   1    Expiration Date:   08/26/2025   Rheumatoid factor    Standing Status:   Future    Number of Occurrences:   1    Expiration Date:   08/26/2025   Sedimentation rate    Standing Status:   Future    Number of Occurrences:   1    Expiration Date:   08/26/2025   CK (Creatine Kinase)   Meds ordered this encounter  Medications   Triamcinolone  Acetonide (ZILRETTA ) intra-articular injection 32 mg   Triamcinolone  Acetonide (ZILRETTA ) intra-articular injection 32 mg     Discussed warning signs or symptoms. Please see discharge instructions. Patient expresses understanding.   The above documentation has been reviewed and is accurate and complete Artist Lloyd, M.D.

## 2024-08-26 NOTE — Patient Instructions (Signed)
 Thank you for coming in today.   You received an injection today. Seek immediate medical attention if the joint becomes red, extremely painful, or is oozing fluid.   Please get labs drawn today before you leave.

## 2024-08-29 LAB — ANTI-NUCLEAR AB-TITER (ANA TITER): ANA Titer 1: 1:40 {titer} — ABNORMAL HIGH

## 2024-08-29 LAB — ANA: Anti Nuclear Antibody (ANA): POSITIVE — AB

## 2024-08-29 LAB — RHEUMATOID FACTOR: Rheumatoid fact SerPl-aCnc: <10 [IU]/mL (ref <14)

## 2024-08-29 LAB — CYCLIC CITRUL PEPTIDE ANTIBODY, IGG: Cyclic Citrullin Peptide Ab: <16 U

## 2024-08-29 LAB — HLA-B27 ANTIGEN: HLA-B27 Antigen: NEGATIVE

## 2024-08-29 NOTE — Telephone Encounter (Signed)
 Was given on 10/24.

## 2024-09-01 ENCOUNTER — Ambulatory Visit: Payer: Self-pay | Admitting: Family Medicine

## 2024-09-01 DIAGNOSIS — G8929 Other chronic pain: Secondary | ICD-10-CM

## 2024-09-01 DIAGNOSIS — M170 Bilateral primary osteoarthritis of knee: Secondary | ICD-10-CM

## 2024-09-01 NOTE — Progress Notes (Signed)
 Rheumatology labs continue to show elevated sedimentation rate.  ANA is minimally positive but I think likely is not relevant.  I will make sure that your rheumatologist gets a copy of these labs so that we can get other labs ordered in the future if needed.

## 2024-09-26 NOTE — Addendum Note (Signed)
 Addended by: JOANE ARTIST RAMAN on: 09/26/2024 07:05 AM   Modules accepted: Orders

## 2024-09-26 NOTE — Progress Notes (Signed)
 I spoke with your rheumatologist they will be seen in January.  She would like me to get an MRI of your right knee which I have ordered.

## 2024-10-06 ENCOUNTER — Inpatient Hospital Stay: Admission: RE | Admit: 2024-10-06 | Discharge: 2024-10-06 | Attending: Family Medicine | Admitting: Family Medicine

## 2024-10-06 DIAGNOSIS — M170 Bilateral primary osteoarthritis of knee: Secondary | ICD-10-CM

## 2024-10-06 DIAGNOSIS — G8929 Other chronic pain: Secondary | ICD-10-CM

## 2024-10-10 NOTE — Progress Notes (Signed)
 Knee MRI does show meniscus fraying and tearing and areas of full-thickness cartilage loss.  I could refer to orthopedic surgery if you would like to have a consultation.  Are you interested in that referral?

## 2024-11-02 ENCOUNTER — Ambulatory Visit: Admitting: Orthopaedic Surgery

## 2024-11-02 VITALS — Ht 70.5 in | Wt 302.0 lb

## 2024-11-02 DIAGNOSIS — S83231D Complex tear of medial meniscus, current injury, right knee, subsequent encounter: Secondary | ICD-10-CM

## 2024-11-02 NOTE — Progress Notes (Signed)
 The patient is a very active 57 year old female sent to me from Dr. Artist Lloyd to evaluate and treat her right knee in terms of a known symptomatic medial meniscal tear.  She is someone who has been on a weight loss journey.  She had been as high as 350 pounds.  She has had mastectomies due to breast cancer and now that is stable.  She has been dealing with right knee pain with locking catching for some time now and she had had conservative treatment including activity modification and quad training exercises as well as several injections in the past by Dr. Lloyd.  She is gone to the point where the knee is hurting on a regular basis with locking and catching and the conservative treatment is no longer helping her.  She has had an MRI of her right knee showing a complex tear of the medial meniscus and no cartilage wear on the medial aspect of her knee.  On examination of her right knee both her knees hyperextend.  Her right knee does have medial joint line tenderness and it does have a positive McMurray's exam to the medial compartment of the knee.  Her Lachman's is negative.  There is slight pain laterally.  There is presently not a lot of patellofemoral crepitation.  The MRI is reviewed and it does show significant cartilage wear of the patellofemoral joint but there is no cartilage irregularity medially or laterally and there is a complex tear from the posterior horn root to the mid body of the meniscus with a horizontal component as well.  There is likely a flap component based on what I am seeing on the MRI and her clinical exam findings.  At this point we did discuss in length and detail arthroscopic interventions for her knee versus nonoperative treatment.  I do not believe that she is heading the knee replacement route and that continue quad strengthening a weight loss is going to help her knee however she is still quite symptomatic with her medial meniscal tear.  I discussed what arthroscopic surgery  involves and discussed the risks and benefits of the surgery.  We did show her a knee model as well.  All questions and concerns were answered addressed.  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  We would let her weight-bear as tolerated and even shower the next day.  We would only put her through physical therapy if we felt like she needed to have extra therapy after surgery with quad strengthening and that would be determined at her first postoperative visit.  All question concerns were answered and addressed.  She is interested in being scheduled for a right knee arthroscopy.

## 2024-11-07 ENCOUNTER — Ambulatory Visit

## 2024-11-07 ENCOUNTER — Ambulatory Visit
Admission: RE | Admit: 2024-11-07 | Discharge: 2024-11-07 | Disposition: A | Discharge status: Home / Self Care | Source: Ambulatory Visit

## 2024-11-07 VITALS — BP 122/82 | HR 63 | Temp 97.3°F | Resp 13 | Ht 69.75 in | Wt 305.0 lb

## 2024-11-07 DIAGNOSIS — M25475 Effusion, left foot: Secondary | ICD-10-CM

## 2024-11-07 DIAGNOSIS — Z86718 Personal history of other venous thrombosis and embolism: Secondary | ICD-10-CM | POA: Diagnosis not present

## 2024-11-07 DIAGNOSIS — R7689 Other specified abnormal immunological findings in serum: Secondary | ICD-10-CM | POA: Diagnosis not present

## 2024-11-07 DIAGNOSIS — Z8759 Personal history of other complications of pregnancy, childbirth and the puerperium: Secondary | ICD-10-CM | POA: Diagnosis not present

## 2024-11-07 DIAGNOSIS — M25472 Effusion, left ankle: Secondary | ICD-10-CM | POA: Diagnosis not present

## 2024-11-07 DIAGNOSIS — M25474 Effusion, right foot: Secondary | ICD-10-CM | POA: Diagnosis not present

## 2024-11-07 DIAGNOSIS — M2550 Pain in unspecified joint: Secondary | ICD-10-CM

## 2024-11-07 DIAGNOSIS — M25471 Effusion, right ankle: Secondary | ICD-10-CM | POA: Diagnosis not present

## 2024-11-07 MED ORDER — PREDNISONE 10 MG PO TABS: ORAL_TABLET | ORAL | 0 refills | Status: AC
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
## 2024-11-10 LAB — PROTEIN / CREATININE RATIO, URINE
Creatinine, Urine: 183 mg/dL (ref 20–275)
Protein/Creat Ratio: 71 mg/g{creat} (ref 24–184)
Protein/Creatinine Ratio: 0.071 mg/mg{creat} (ref 0.024–0.184)
Total Protein, Urine: 13 mg/dL (ref 5–24)

## 2024-11-10 LAB — BETA-2 GLYCOPROTEIN ANTIBODIES
Beta-2 Glyco 1 IgA: <2 U/mL
Beta-2 Glyco 1 IgM: <2 U/mL
Beta-2 Glyco I IgG: <2 U/mL

## 2024-11-10 LAB — LUPUS ANTICOAGULANT EVAL W/ REFLEX
PTT-LA Screen: 39 s
dRVVT: 41 s

## 2024-11-10 LAB — PHOSPHATIDYLSERINE/PROTHROMBIN (PS/PT) ANTIBODIES (IGG, IGM)
Phosphatidylserine/Prothrombin Ab (IgG): <9 U
Phosphatidylserine/Prothrombin Ab (IgM): 11 U

## 2024-11-10 LAB — SJOGREN'S SYNDROME ANTIBODS(SSA + SSB)
SSA (Ro) (ENA) Antibody, IgG: <1 AI
SSB (La) (ENA) Antibody, IgG: <1 AI

## 2024-11-10 LAB — CARDIOLIPIN ANTIBODIES, IGG, IGM, IGA
Anticardiolipin IgA: <2 [APL'U]/mL
Anticardiolipin IgG: <2 [GPL'U]/mL
Anticardiolipin IgM: <2 [MPL'U]/mL

## 2024-11-10 LAB — C3 AND C4
C3 Complement: 154 mg/dL (ref 83–193)
C4 Complement: 31 mg/dL (ref 15–57)

## 2024-11-10 LAB — ANTI-SMITH ANTIBODY: ENA SM Ab Ser-aCnc: <1 AI

## 2024-11-10 LAB — ANTI-DNA ANTIBODY, DOUBLE-STRANDED: ds DNA Ab: 3 [IU]/mL

## 2024-11-10 LAB — ANGIOTENSIN CONVERTING ENZYME: Angiotensin-Converting Enzyme: 29 U/L (ref 9–67)

## 2024-11-11 ENCOUNTER — Telehealth: Payer: Self-pay

## 2024-11-11 NOTE — Telephone Encounter (Signed)
 I called patient to discuss scheduling knee scope.  Left voice mail message for return call.

## 2024-11-22 ENCOUNTER — Ambulatory Visit: Admitting: Internal Medicine

## 2024-11-23 ENCOUNTER — Ambulatory Visit: Admitting: Internal Medicine

## 2024-11-23 ENCOUNTER — Encounter: Payer: Self-pay | Admitting: Internal Medicine

## 2024-11-23 ENCOUNTER — Other Ambulatory Visit

## 2024-11-23 VITALS — BP 138/80 | HR 67 | Ht 69.75 in | Wt 310.0 lb

## 2024-11-23 DIAGNOSIS — E041 Nontoxic single thyroid nodule: Secondary | ICD-10-CM | POA: Diagnosis not present

## 2024-11-23 DIAGNOSIS — E890 Postprocedural hypothyroidism: Secondary | ICD-10-CM
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
## 2024-11-24 ENCOUNTER — Ambulatory Visit: Payer: Self-pay | Admitting: Internal Medicine

## 2024-11-24 ENCOUNTER — Ambulatory Visit: Payer: Self-pay

## 2024-11-24 LAB — TSH: TSH: 1.25 m[IU]/L (ref 0.40–4.50)

## 2024-11-24 LAB — T4, FREE: Free T4: 1.3 ng/dL (ref 0.8–1.8)

## 2024-11-24 MED ORDER — LEVOTHYROXINE SODIUM 50 MCG PO TABS: 50.0000 ug | ORAL_TABLET | Freq: Every day | ORAL | 3 refills | Status: AC
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
## 2024-12-07 ENCOUNTER — Ambulatory Visit
Admission: RE | Admit: 2024-12-07 | Discharge: 2024-12-07 | Disposition: A | Source: Ambulatory Visit | Attending: Internal Medicine | Admitting: Internal Medicine

## 2024-12-07 DIAGNOSIS — E041 Nontoxic single thyroid nodule: Secondary | ICD-10-CM

## 2024-12-12 ENCOUNTER — Ambulatory Visit

## 2024-12-12 DIAGNOSIS — M25475 Effusion, left foot: Secondary | ICD-10-CM

## 2024-12-12 DIAGNOSIS — M2550 Pain in unspecified joint: Secondary | ICD-10-CM

## 2024-12-12 DIAGNOSIS — R7689 Other specified abnormal immunological findings in serum: Secondary | ICD-10-CM

## 2024-12-29 ENCOUNTER — Encounter: Admitting: Orthopaedic Surgery

## 2025-11-23 ENCOUNTER — Ambulatory Visit: Admitting: Internal Medicine
